# Patient Record
Sex: Male | Born: 1960 | Race: White | Hispanic: No | Marital: Married | Smoking: Never smoker
Health system: Southern US, Community
[De-identification: ages and names within clinical notes are randomized; demographics above are authoritative.]

## PROBLEM LIST (undated history)

## (undated) DIAGNOSIS — I10 Essential (primary) hypertension: Secondary | ICD-10-CM

## (undated) DIAGNOSIS — F909 Attention-deficit hyperactivity disorder, unspecified type: Secondary | ICD-10-CM

## (undated) DIAGNOSIS — F419 Anxiety disorder, unspecified: Secondary | ICD-10-CM

---

## 1999-12-08 ENCOUNTER — Ambulatory Visit (HOSPITAL_COMMUNITY): Admission: EM | Admit: 1999-12-08 | Discharge: 1999-12-08 | Payer: Self-pay | Admitting: Internal Medicine

## 2000-01-01 ENCOUNTER — Ambulatory Visit (HOSPITAL_COMMUNITY): Admission: RE | Admit: 2000-01-01 | Discharge: 2000-01-01 | Payer: Self-pay | Admitting: Gastroenterology

## 2000-01-01 ENCOUNTER — Encounter: Payer: Self-pay | Admitting: Gastroenterology

## 2003-11-19 ENCOUNTER — Emergency Department (HOSPITAL_COMMUNITY): Admission: EM | Admit: 2003-11-19 | Discharge: 2003-11-19 | Payer: Self-pay | Admitting: Emergency Medicine

## 2007-11-11 ENCOUNTER — Ambulatory Visit (HOSPITAL_COMMUNITY): Admission: RE | Admit: 2007-11-11 | Discharge: 2007-11-11 | Payer: Self-pay | Admitting: Gastroenterology

## 2010-04-20 ENCOUNTER — Encounter: Admission: RE | Admit: 2010-04-20 | Discharge: 2010-04-20 | Payer: Self-pay | Admitting: Gastroenterology

## 2010-10-03 NOTE — Op Note (Signed)
NAME:  Brett Hester, Brett Hester               ACCOUNT NO.:  [id number]   MEDICAL RECORD NO.:  [id number]          PATIENT TYPE:  AMB   LOCATION:  ENDO                         FACILITY:  [facility]   PHYSICIAN:  James L. Malon Kindle., M.D.DATE OF BIRTH:  1961-02-25   DATE OF PROCEDURE:  11/11/2007  DATE OF DISCHARGE:                               OPERATIVE REPORT   HISTORY:  The patient has had a history of previous meat impaction and  was eating steak tonight and he has been unable swallow since.  We had  come to the emergency room while a brief history was obtained after the  endoscopy.  The patient is on Aciphex and some blood pressure  medication.   ALLERGIES:  HE HAS NO DRUG ALLERGY.   He has no heart disease.  His only medical problem is mild hypertension.  He has had a history of previous meat impactions that have been removed.  He is suppose to take Aciphex, but due to financial reasons has not been  taking it.   PHYSICAL EXAMINATION:  HEART:  Regular rate and rhythm without murmurs  or gallops.  LUNGS:  Clear.  THROAT:  Normal.   PROCEDURE:  Esophagogastroduodenoscopy with removal of meat impaction.   MEDICATIONS:  Cetacaine spray, fentanyl 100 mcg, Versed 12 mg IV.   DESCRIPTION OF PROCEDURE:  The procedure, including the risks of  perforation, bleeding or possibility of further surgery to remove this  were discussed with the patient prior to the procedure.  He has had it  done a couple of times, so he is well aware of these.  He was sedated.  The scope was passed.  A large meat impaction was seen in the distal  esophagus with a large amount of saliva.  This was sucked out.  We  attempted to get the Lucina Mellow net around the meat, it was too hard and I  switched over to the tripod retriever.  Multiple pieces were flaked off  with the tripod, and then we were able to reinsert the Lucina Mellow net and  pulled two large pieces out.  We placed the scope and the remainder of  it pushed through easily  into the stomach.  The stomach was entered.  A  large amount of food material was seen in the stomach which was along  the lesser curve.  The duodenum, including the bulb and second portion  were fine.  The scope was withdrawn back into the esophagus.  There were  several linear ulcerations in the esophagus and a stricture, but no  residual food material.  The proximal esophagus was normal.  The scope  was withdrawn.  There were no immediate complications.  The patient was  resting comfortably.   ASSESSMENT:  1. Meat impaction in the distal esophagus removed.  2. Stricture of the distal esophagus.  3. Ulcerative esophagitis, partially due to him not taking any      Aciphex, as well as to the meat being impacted during the      procedure.   PLAN:  I have discussed this with his wife.  He will  take liquids  tonight, Aciphex with a sip of water.  Tomorrow, start on clear liquids.  Call for any signs of chest pain, shortness of breath, bleeding, etc.  Will follow up with Dr. Madilyn Fireman in 1 month with possible dilatation.           ______________________________  Llana Aliment Malon Kindle., M.D.     Waldron Session  D:  11/11/2007  T:  11/12/2007  Job:  [id number]   cc:   Carma Leaven, DO  Fax: [phone]   Everardo All. Madilyn Fireman, M.D.  Fax: [phone]

## 2010-10-06 NOTE — Procedures (Signed)
[facility]  Patient:    Brett Hester, Brett Hester                      MRN: [id number] Proc. Date: 01/01/00 Adm. Date:  [id number] Attending:  Louie Bun                           Procedure Report  PROCEDURE:  Esophagogastroduodenoscopy with esophageal dilatation.  INDICATIONS FOR PROCEDURE:  A patient who presented with an esophageal food impaction requiring endoscopic impaction with stricture suggested. He has been on Prilosec in the interim and procedure is for elective inspection of the distal esophagus and to perform a dilatation if appropriate.  DESCRIPTION OF PROCEDURE:  The patient was placed in the left lateral decubitus position and placed on the pulse monitor with continuous low flow oxygen delivered by nasal cannula. He was sedated with 80 mg IV Demerol and 9 mg IV Versed. The Olympus video endoscope was advanced under direct vision into the oropharynx and esophagus. The esophagus was straight and of normal caliber with the squamocolumnar line at 38 cm. There was some indistinct fibrosis around the gastroesophageal junction with no clear cut ring but suggestive of a mild stricture. There was also 1 or 2 persistent erosions, 1 with a small amount of exudate extending up 1-2 cm from the gastroesophageal junction. There is no visible suspicious of neoplasm. The stomach was entered and a small amount of liquid secretions were suctioned from the fundus. Retroflexed view of the cardia was unremarkable. There was no discernible hiatal hernia. The fundus, body, antrum and pylorus all appeared normal. The duodenum was entered and both the bulb and second portion were well inspected and appeared to be within normal limits. The Savary guidewire was placed through the endoscope channel and the scope withdrawn. A single Savary dilator of 16 mm was passed under fluoroscopic visualization with no blood seen on withdrawal and minimal resistance. This dilator  was removed together with the wire and the patient returned to the recovery room in stable condition. The patient tolerated the procedure well and there were no immediate complications.  IMPRESSION:  1. Mild to moderate erosive esophagitis.  2. Mild distal esophageal stricture status post dilatation to 16 mm.  PLAN:  Continue Prilosec and antireflux precautions. Follow-up in the office in 6-8 weeks. DD:  01/01/00 TD:  01/01/00 Job: [id number]

## 2010-10-06 NOTE — Procedures (Signed)
[facility]  Patient:    Brett Hester, Brett Hester                      MRN: [id number] Proc. Date: 12/08/99 Adm. Date:  [id number] Disc. Date: [id number] Attending:  Devoria Albe CC:         Dellis Anes. Idell Pickles, M.D.                           Procedure Report  PROCEDURE:  Esophagogastroduodenoscopy with removal of foreign body.  ENDOSCOPIST:  Everardo All. Madilyn Fireman, M.D.  ANESTHESIA:  INDICATIONS FOR PROCEDURE:  Sensation of esophageal obstruction after choking on a pork chop earlier this evening.  Unable to clear saliva, keep any liquids or solids since with efforts in the emergency room to effect passage of the ______ by pharmacologic means unsuccessful.  DESCRIPTION OF PROCEDURE:  The patient was placed in the left lateral decubitus position and placed on the pulse monitor with continuous low flow oxygen delivered by nasal cannula.  He was sedated with 60 mg of IV Demerol and 6 mg of IV Versed.  The Olympus video endoscope was advanced under direct vision into the oropharynx and esophagus.  The esophagus was filled with ________ which was easily suctioned, revealing a solid food bolus that was impacted in the distal esophagus.  I could not push the bolus through the GE junction and used the rat-tooth forceps to remove about half of it through the mouth.  The scope was reintroduced and the remaining fragment was able to be pushed into the stomach revealing friability around a probable ______ stricture or esophageal ring above the 3 cm hiatal hernia.  Due to some food in the stomach and the patient retching, I did not spend a lot of time examining the stomach and did not intubate the duodenum.  The scope was drawn back into the esophagus and no further solid food fragments were seen.  The patient was then withdrawn and the patient returned to the recovery room in stable condition.  He tolerated the procedure well and there were no immediate complications.  IMPRESSION: 1.  Foreign body in the esophagus. 2. Lower esophageal ring with hiatal hernia.  PLAN:  Trial of proton pump inhibitor and we will reschedule EGD with elective dilatation in approximately three to four weeks. DD:  12/08/99 TD:  12/12/99 Job: [id number]

## 2010-10-29 ENCOUNTER — Ambulatory Visit (HOSPITAL_COMMUNITY)
Admission: EM | Admit: 2010-10-29 | Discharge: 2010-10-29 | Disposition: A | Discharge status: Home / Self Care | Payer: BC Managed Care – PPO | Source: Ambulatory Visit | Attending: Gastroenterology | Admitting: Gastroenterology

## 2010-10-29 DIAGNOSIS — K222 Esophageal obstruction: Secondary | ICD-10-CM | POA: Insufficient documentation

## 2010-10-29 DIAGNOSIS — IMO0002 Reserved for concepts with insufficient information to code with codable children: Secondary | ICD-10-CM | POA: Insufficient documentation

## 2010-10-29 DIAGNOSIS — T18108A Unspecified foreign body in esophagus causing other injury, initial encounter: Secondary | ICD-10-CM | POA: Insufficient documentation

## 2010-12-06 NOTE — Op Note (Signed)
  NAMERODRIGO, MCGRANAHAN NO.:  [id number]  MEDICAL RECORD NO.:  [id number]  LOCATION:  ENDO                         FACILITY:  [facility]  PHYSICIAN:  Orra Nolde C. Madilyn Fireman, M.D.    DATE OF BIRTH:  19-Sep-1960  DATE OF PROCEDURE:  10/29/2010 DATE OF DISCHARGE:  10/29/2010                              OPERATIVE REPORT   ESOPHAGOGASTRODUODENOSCOPY WITH REMOVAL OF FOREIGN BODY  INDICATIONS FOR PROCEDURE:  The patient with history of recurrent esophageal food impactions.  In past, it has not been clear that he has a definable esophageal stricture and the appearance of the esophageal mucosa on one occasion was felt to suggest eosinophilic esophagitis. However, biopsies have not bourne this out.  A barium swallow with tablet was done, which showed a short distal stricture in December 2011, it was recommended we go ahead and proceed with EGD, but the patient has not complied.  The patient was eating chicken and biscuit at lunch time today about 5 hours ago and felt sensation of a bite of chicken impact into the esophagus, and since then, he has been unable to swallow and has spitting up his saliva.  PROCEDURE:  The patient was placed in the left lateral decubitus position and placed on the pulse monitor with continuous low-flow oxygen delivered by nasal cannula.  He was sedated with 100 mcg IV fentanyl and 8 mg IV Versed.  The Olympus video endoscope was advanced under direct vision into the oropharynx and esophagus.  The esophagus was straight and of normal caliber.  There was a lot of saliva proximally.  In the distal esophagus, there was seen a food bolus consistent with a piece of chicken he described.  It was pushed with mild force and released through the GE junction into the stomach.  The underlying mucosa about somewhat boggy and edematous, but I could not clearly discern any discrete stricture or ring.  There were some mild changes of concentric rings, but this was not  traumatic.  There was no esophageal laceration or ulcer.  The stomach was entered and a small amount of liquid secretions were suctioned from the fundus.  Retroflexed view of cardia was unremarkable.  The fundus, body, antrum, and pylorus all appeared normal.  The duodenum was entered, both bulb and second portion were inspected, and appeared to be within normal limits.  Scope was then withdrawn and the patient returned to the recovery room in stable condition.  He tolerated the procedure well.  There were no immediate complications.  IMPRESSION:  Solid food bolus in the distal esophagus removed and pushed into the stomach endoscopically.  PLAN:  We will start proton pump inhibitor and recommend that he follow up with EGD and esophageal dilatation within a month or 2.          ______________________________ Everardo All. Madilyn Fireman, M.D.     [facility]/MED[facility]  D:  10/29/2010  T:  10/30/2010  Job:  [id number]  Electronically Signed by Dorena Cookey M.D. on 12/06/2010 07:00:12 PM

## 2015-09-16 MED FILL — DEXTROAMP-AMPHET ER 20 MG C: 20 | 30 days supply | Qty: 30 | Fill #0

## 2015-09-19 MED FILL — GAVILYTE-N SOLUTION: 420 | 1 days supply | Qty: 4000 | Fill #0

## 2015-09-30 MED FILL — OMEPRAZOLE DR 40 MG CAPSULE: 40 | 30 days supply | Qty: 60 | Fill #0

## 2015-10-07 MED FILL — RABEPRAZOLE SOD DR 20 MG TA: 20 | 30 days supply | Qty: 30 | Fill #0

## 2015-10-19 MED FILL — VYVANSE 10 MG CAPSULE: 10 | 30 days supply | Qty: 30 | Fill #0

## 2016-02-14 MED FILL — CEPHALEXIN 500 MG CAPSULE: 500 | 10 days supply | Qty: 20 | Fill #0

## 2016-02-15 MED FILL — predniSONE 10 MG TABS: 10 | 12 days supply | Qty: 42 | Fill #0

## 2016-02-15 MED FILL — SSD 1% CREAM: 1 | 10 days supply | Qty: 50 | Fill #0

## 2016-02-20 MED FILL — MYDAYIS ER 37.5 MG CAPSULE: 37.5 | 30 days supply | Qty: 30 | Fill #0

## 2016-02-20 MED FILL — ALPRAZolam 0.5 MG TABS: 0.5 | 7 days supply | Qty: 7 | Fill #0

## 2016-02-24 MED FILL — RABEPRAZOLE SOD DR 20 MG TA: 20 | 30 days supply | Qty: 30 | Fill #1

## 2016-02-28 MED FILL — ATORVASTATIN 10 MG TABLET: 10 | 30 days supply | Qty: 30 | Fill #0

## 2016-04-02 MED FILL — MYDAYIS ER 37.5 MG CAPSULE: 37.5 | 30 days supply | Qty: 30 | Fill #0

## 2016-05-28 MED FILL — RABEPRAZOLE SOD DR 20 MG TA: 20 | 30 days supply | Qty: 30 | Fill #2

## 2016-06-02 ENCOUNTER — Encounter (HOSPITAL_COMMUNITY): Payer: Self-pay | Admitting: Emergency Medicine

## 2016-06-02 ENCOUNTER — Emergency Department (HOSPITAL_COMMUNITY): Payer: BLUE CROSS/BLUE SHIELD

## 2016-06-02 ENCOUNTER — Observation Stay (HOSPITAL_COMMUNITY)
Admission: EM | Admit: 2016-06-02 | Discharge: 2016-06-03 | Disposition: A | Discharge status: Home / Self Care | Payer: BLUE CROSS/BLUE SHIELD | Attending: Internal Medicine | Admitting: Internal Medicine

## 2016-06-02 DIAGNOSIS — F909 Attention-deficit hyperactivity disorder, unspecified type: Secondary | ICD-10-CM | POA: Insufficient documentation

## 2016-06-02 DIAGNOSIS — I10 Essential (primary) hypertension: Secondary | ICD-10-CM | POA: Insufficient documentation

## 2016-06-02 DIAGNOSIS — Z6835 Body mass index (BMI) 35.0-35.9, adult: Secondary | ICD-10-CM | POA: Diagnosis not present

## 2016-06-02 DIAGNOSIS — F419 Anxiety disorder, unspecified: Secondary | ICD-10-CM | POA: Diagnosis not present

## 2016-06-02 DIAGNOSIS — G459 Transient cerebral ischemic attack, unspecified: Secondary | ICD-10-CM | POA: Diagnosis not present

## 2016-06-02 DIAGNOSIS — R202 Paresthesia of skin: Secondary | ICD-10-CM | POA: Diagnosis not present

## 2016-06-02 DIAGNOSIS — I160 Hypertensive urgency: Principal | ICD-10-CM

## 2016-06-02 DIAGNOSIS — E785 Hyperlipidemia, unspecified: Secondary | ICD-10-CM | POA: Diagnosis not present

## 2016-06-02 DIAGNOSIS — Z79899 Other long term (current) drug therapy: Secondary | ICD-10-CM | POA: Insufficient documentation

## 2016-06-02 DIAGNOSIS — E669 Obesity, unspecified: Secondary | ICD-10-CM | POA: Insufficient documentation

## 2016-06-02 DIAGNOSIS — R42 Dizziness and giddiness: Secondary | ICD-10-CM

## 2016-06-02 HISTORY — DX: Attention-deficit hyperactivity disorder, unspecified type: F90.9

## 2016-06-02 HISTORY — DX: Essential (primary) hypertension: I10

## 2016-06-02 HISTORY — DX: Anxiety disorder, unspecified: F41.9

## 2016-06-02 LAB — URINALYSIS, ROUTINE W REFLEX MICROSCOPIC
BILIRUBIN URINE: NEGATIVE
Bacteria, UA: NONE SEEN
Glucose, UA: NEGATIVE mg/dL
HGB URINE DIPSTICK: NEGATIVE
KETONES UR: NEGATIVE mg/dL
NITRITE: NEGATIVE
PH: 6 (ref 5.0–8.0)
Protein, ur: NEGATIVE mg/dL
SPECIFIC GRAVITY, URINE: 1.005 (ref 1.005–1.030)
Squamous Epithelial / LPF: NONE SEEN

## 2016-06-02 LAB — COMPREHENSIVE METABOLIC PANEL
ALK PHOS: 46 U/L (ref 38–126)
ALT: 20 U/L (ref 17–63)
ANION GAP: 12 (ref 5–15)
AST: 23 U/L (ref 15–41)
Albumin: 4.1 g/dL (ref 3.5–5.0)
BUN: 13 mg/dL (ref 6–20)
CALCIUM: 10 mg/dL (ref 8.9–10.3)
CO2: 24 mmol/L (ref 22–32)
CREATININE: 1.22 mg/dL (ref 0.61–1.24)
Chloride: 104 mmol/L (ref 101–111)
GFR calc non Af Amer: >60 mL/min (ref >60)
GLUCOSE: 88 mg/dL (ref 65–99)
Potassium: 4.8 mmol/L (ref 3.5–5.1)
SODIUM: 140 mmol/L (ref 135–145)
TOTAL PROTEIN: 6.6 g/dL (ref 6.5–8.1)
Total Bilirubin: 0.6 mg/dL (ref 0.3–1.2)

## 2016-06-02 LAB — I-STAT CHEM 8, ED
BUN: 15 mg/dL (ref 6–20)
CALCIUM ION: 1.25 mmol/L (ref 1.15–1.40)
CHLORIDE: 102 mmol/L (ref 101–111)
CREATININE: 1.2 mg/dL (ref 0.61–1.24)
GLUCOSE: 88 mg/dL (ref 65–99)
HCT: 45 % (ref 39.0–52.0)
Hemoglobin: 15.3 g/dL (ref 13.0–17.0)
Potassium: 4.1 mmol/L (ref 3.5–5.1)
SODIUM: 141 mmol/L (ref 135–145)
TCO2: 29 mmol/L (ref 0–100)

## 2016-06-02 LAB — APTT: aPTT: 23 seconds — ABNORMAL LOW (ref 24–36)

## 2016-06-02 LAB — RAPID URINE DRUG SCREEN, HOSP PERFORMED
AMPHETAMINES: POSITIVE — AB
Barbiturates: NOT DETECTED
Benzodiazepines: NOT DETECTED
Cocaine: NOT DETECTED
Opiates: NOT DETECTED
TETRAHYDROCANNABINOL: NOT DETECTED

## 2016-06-02 LAB — DIFFERENTIAL
Basophils Absolute: 0 10*3/uL (ref 0.0–0.1)
Basophils Relative: 0 %
EOS PCT: 4 %
Eosinophils Absolute: 0.3 10*3/uL (ref 0.0–0.7)
LYMPHS ABS: 2.2 10*3/uL (ref 0.7–4.0)
LYMPHS PCT: 28 %
MONO ABS: 0.6 10*3/uL (ref 0.1–1.0)
Monocytes Relative: 7 %
NEUTROS ABS: 4.9 10*3/uL (ref 1.7–7.7)
Neutrophils Relative %: 61 %

## 2016-06-02 LAB — CBC
HCT: 45.9 % (ref 39.0–52.0)
HEMOGLOBIN: 15.1 g/dL (ref 13.0–17.0)
MCH: 30.3 pg (ref 26.0–34.0)
MCHC: 32.9 g/dL (ref 30.0–36.0)
MCV: 92 fL (ref 78.0–100.0)
PLATELETS: 208 10*3/uL (ref 150–400)
RBC: 4.99 MIL/uL (ref 4.22–5.81)
RDW: 12.7 % (ref 11.5–15.5)
WBC: 8 10*3/uL (ref 4.0–10.5)

## 2016-06-02 LAB — I-STAT TROPONIN, ED: Troponin i, poc: 0 ng/mL (ref 0.00–0.08)

## 2016-06-02 LAB — PROTIME-INR
INR: 0.94
PROTHROMBIN TIME: 12.6 s (ref 11.4–15.2)

## 2016-06-02 LAB — ETHANOL: Alcohol, Ethyl (B): <5 mg/dL (ref <5)

## 2016-06-02 MED ORDER — ACETAMINOPHEN 160 MG/5ML PO SOLN: 650.0000 mg | ORAL | Status: DC | PRN

## 2016-06-02 MED ORDER — ENOXAPARIN SODIUM 40 MG/0.4ML ~~LOC~~ SOLN
40.0000 mg | SUBCUTANEOUS | Status: DC
  Administered 2016-06-03: 40 mg via SUBCUTANEOUS
  Filled 2016-06-02: qty 0.4

## 2016-06-02 MED ORDER — ACETAMINOPHEN 650 MG RE SUPP: 650.0000 mg | RECTAL | Status: DC | PRN

## 2016-06-02 MED ORDER — ACETAMINOPHEN 325 MG PO TABS: 650.0000 mg | ORAL_TABLET | ORAL | Status: DC | PRN

## 2016-06-02 MED ORDER — STROKE: EARLY STAGES OF RECOVERY BOOK
Freq: Once | Status: AC
  Administered 2016-06-02: 23:00:00
  Filled 2016-06-02: qty 1

## 2016-06-02 MED ORDER — PANTOPRAZOLE SODIUM 40 MG PO TBEC
40.0000 mg | DELAYED_RELEASE_TABLET | Freq: Every day | ORAL | Status: DC
  Administered 2016-06-03: 40 mg via ORAL
  Filled 2016-06-02: qty 1

## 2016-06-02 MED ORDER — ALPRAZOLAM 0.5 MG PO TABS: 0.5000 mg | ORAL_TABLET | Freq: Every evening | ORAL | Status: DC | PRN

## 2016-06-02 MED ORDER — ASPIRIN EC 81 MG PO TBEC
81.0000 mg | DELAYED_RELEASE_TABLET | Freq: Every day | ORAL | Status: DC
  Administered 2016-06-03: 81 mg via ORAL
  Filled 2016-06-02: qty 1

## 2016-06-02 NOTE — ED Provider Notes (Signed)
[facility]-EMERGENCY DEPT Provider Note   CSN: [id number] Arrival date & time: 06/02/16  1736     History   Chief Complaint Chief Complaint  Patient presents with  . Dizziness    HPI Brett Hester is a 56 y.o. male with a pmh of hypertension and ADHD. He states that at 4:15pm today he had sudden onset of left facial paresthesia and tongue numbness lasting about 10 sec. He had associated lightheadedness and a sense of head "fullness" that lasted 45 minutes and has been intermittent since that time. He Reports that when EMS arrived to his house his systolic pressure was in the 200s. Is currently resolved without intervention. He denies any other neurologic deficits such as headache, difficulty with speech, facial droop, unilateral weakness. The patient does a daily dose of 37.5 mg of a stimulant medication for his adhd. He also takes medication for his BP daily. He has a family hx of atherosclerosis. He denies a smoking history and has no hx of hyperlipidemia.    HPI  Past Medical History:  Diagnosis Date  . ADHD   . Anxiety   . Hypertension     Patient Active Problem List   Diagnosis Date Noted  . Hypertensive urgency 06/02/2016  . TIA (transient ischemic attack) 06/02/2016    History reviewed. No pertinent surgical history.     Home Medications    Prior to Admission medications   Medication Sig Start Date End Date Taking? Authorizing Provider  ALPRAZolam Prudy Feeler(XANAX) 0.5 MG tablet Take 0.5 mg by mouth at bedtime as needed for anxiety.   Yes Historical Provider, MD  Amphet-Dextroamphet 3-Bead ER (MYDAYIS) 37.5 MG CP24 Take by mouth.   Yes Historical Provider, MD  RABEprazole (ACIPHEX) 20 MG tablet Take 20 mg by mouth daily.   Yes Historical Provider, MD    Family History History reviewed. No pertinent family history.  Social History Social History  Substance Use Topics  . Smoking status: Never Smoker  . Smokeless tobacco: Not on file  . Alcohol use No     Allergies     Patient has no known allergies.   Review of Systems Review of Systems Ten systems reviewed and are negative for acute change, except as noted in the HPI.    Physical Exam Updated Vital Signs BP 136/82   Pulse 69   Temp 98.4 F (36.9 C) (Oral)   Resp 25   Ht 6' (1.829 m)   Wt 120.2 kg   SpO2 97%   BMI 35.94 kg/m   Physical Exam  Constitutional: He is oriented to person, place, and time. He appears well-developed and well-nourished. No distress.  HENT:  Head: Normocephalic and atraumatic.  Eyes: Conjunctivae are normal. No scleral icterus.  Neck: Normal range of motion. Neck supple.  Cardiovascular: Normal rate, regular rhythm and normal heart sounds.   Pulmonary/Chest: Effort normal and breath sounds normal. No respiratory distress.  Abdominal: Soft. There is no tenderness.  Musculoskeletal: He exhibits no edema.  Neurological: He is alert and oriented to person, place, and time.  Speech is clear and goal oriented, follows commands Major Cranial nerves without deficit, no facial droop Normal strength in upper and lower extremities bilaterally including dorsiflexion and plantar flexion, strong and equal grip strength Sensation normal to light and sharp touch Moves extremities without ataxia, coordination intact Normal finger to nose and rapid alternating movements Neg romberg, no pronator drift Normal gait Normal heel-shin and balance   Skin: Skin is warm and dry. He is  not diaphoretic.  Psychiatric: His behavior is normal.  Nursing note and vitals reviewed.    ED Treatments / Results  Labs (all labs ordered are listed, but only abnormal results are displayed) Labs Reviewed  APTT - Abnormal; Notable for the following:       Result Value   aPTT 23 (*)    All other components within normal limits  RAPID URINE DRUG SCREEN, HOSP PERFORMED - Abnormal; Notable for the following:    Amphetamines POSITIVE (*)    All other components within normal limits  URINALYSIS,  ROUTINE W REFLEX MICROSCOPIC - Abnormal; Notable for the following:    Color, Urine STRAW (*)    Leukocytes, UA SMALL (*)    All other components within normal limits  ETHANOL  PROTIME-INR  CBC  DIFFERENTIAL  COMPREHENSIVE METABOLIC PANEL  RAPID URINE DRUG SCREEN, HOSP PERFORMED  URINALYSIS, ROUTINE W REFLEX MICROSCOPIC  HEMOGLOBIN A1C  LIPID PANEL  I-STAT CHEM 8, ED  I-STAT TROPOININ, ED    EKG  EKG Interpretation None       Radiology Ct Head Code Stroke Wo Contrast  Result Date: 06/02/2016 CLINICAL DATA:  Code stroke. Dizziness with LEFT facial and tongue numbness beginning earlier today. EXAM: CT HEAD WITHOUT CONTRAST TECHNIQUE: Contiguous axial images were obtained from the base of the skull through the vertex without intravenous contrast. COMPARISON:  None. FINDINGS: Brain: No evidence of acute infarction, hemorrhage, hydrocephalus, extra-axial collection or mass lesion/mass effect. Normal cerebral volume. Mild hypoattenuation of white matter could represent early chronic microvascular ischemic change. Vascular: No hyperdense vessel or unexpected calcification. Skull: Normal. Negative for fracture or focal lesion. Sinuses/Orbits: No acute finding. Other: None. ASPECTS [facility] Stroke Program Early CT Score) - Ganglionic level infarction (caudate, lentiform nuclei, internal capsule, insula, M1-M3 cortex): 7 - Supraganglionic infarction (M4-M6 cortex): 3 Total score (0-10 with 10 being normal): 10 IMPRESSION: 1. No acute intracranial findings. Mild chronic microvascular ischemic changes suspected. 2. ASPECTS is 10. A call was placed to the stroke neurologist at 7:15 p.m., 06/02/2016. Electronically Signed   By: Elsie Stain M.D.   On: 06/02/2016 19:19    Procedures Procedures (including critical care time)  Medications Ordered in ED Medications  acetaminophen (TYLENOL) tablet 650 mg (not administered)    Or  acetaminophen (TYLENOL) solution 650 mg (not administered)    Or    acetaminophen (TYLENOL) suppository 650 mg (not administered)  enoxaparin (LOVENOX) injection 40 mg (not administered)  aspirin EC tablet 81 mg (not administered)  ALPRAZolam (XANAX) tablet 0.5 mg (not administered)  pantoprazole (PROTONIX) EC tablet 40 mg (not administered)  LORazepam (ATIVAN) injection 1 mg (not administered)   stroke: mapping our early stages of recovery book ( Does not apply Given 06/02/16 2315)     Initial Impression / Assessment and Plan / ED Course  I have reviewed the triage vital signs and the nursing notes.  Pertinent labs & imaging results that were available during my care of the patient were reviewed by me and considered in my medical decision making (see chart for details).  Clinical Course     Patient seen by Dr. Otelia Limes. Code stroke discontinued. Patient will need to be admitted for a TIA work up Patient admitted by Dr. Julian Reil Final Clinical Impressions(s) / ED Diagnoses   Final diagnoses:  Dizziness  Transient cerebral ischemia, unspecified type    New Prescriptions Current Discharge Medication List       Arthor Captain, PA-C 06/03/16 0044    Margarita Grizzle, MD  06/07/16 1336  

## 2016-06-02 NOTE — ED Notes (Signed)
Patient physically moved from hallway to room A12; patient undressed, in gown, on monitor, continuous pulse oximetry and blood pressure cuff; visitor at bedside

## 2016-06-02 NOTE — ED Notes (Signed)
[facility] contacted to activate Code Stroke 

## 2016-06-02 NOTE — H&P (Signed)
History and Physical    Brett Hester WUJ:[id number] DOB: [birth year] DOA: 06/02/2016   PCP: Thora Lance, MD Chief Complaint:  Chief Complaint  Patient presents with  . Dizziness    HPI: Brett Hester is a 56 y.o. male with medical history significant of HTN, anxiety, ADHD.  Patient presents to the ED after sudden onset at 4:15PM today of L facial paresthesia, tongue numbness.  Symptoms lasted for about 10 seconds.  Had associated lightheadedness and sense of head "fullness" that lasted 45 mins and has been intermittent since then.  EMS arrived to house and SBP was in the 200s (took it twice he says).  ED Course: SBP 180s in triage and has continued to trend down on its own without intervention, now running 140s.  Review of Systems: As per HPI otherwise 10 point review of systems negative.    Past Medical History:  Diagnosis Date  . ADHD   . Anxiety   . Hypertension     History reviewed. No pertinent surgical history.   reports that he has never smoked. He does not have any smokeless tobacco history on file. He reports that he does not drink alcohol or use drugs.  No Known Allergies  History reviewed. No pertinent family history.  Does have family history of atherosclerosis.   Prior to Admission medications   Medication Sig Start Date End Date Taking? Authorizing Provider  ALPRAZolam Prudy Feeler) 0.5 MG tablet Take 0.5 mg by mouth at bedtime as needed for anxiety.   Yes Historical Provider, MD  Amphet-Dextroamphet 3-Bead ER (MYDAYIS) 37.5 MG CP24 Take by mouth.   Yes Historical Provider, MD  RABEprazole (ACIPHEX) 20 MG tablet Take 20 mg by mouth daily.   Yes Historical Provider, MD    Physical Exam: Vitals:   06/02/16 2100 06/02/16 2130 06/02/16 2200 06/02/16 2230  BP: 142/83 135/84 139/88 140/84  Pulse: 66 76 69 70  Resp:  20 22 22   Temp:      TempSrc:      SpO2: 98% 96% 96% 96%  Weight:      Height:          Constitutional: NAD, calm, comfortable Eyes:  PERRL, lids and conjunctivae normal ENMT: Mucous membranes are moist. Posterior pharynx clear of any exudate or lesions.Normal dentition.  Neck: normal, supple, no masses, no thyromegaly Respiratory: clear to auscultation bilaterally, no wheezing, no crackles. Normal respiratory effort. No accessory muscle use.  Cardiovascular: Regular rate and rhythm, no murmurs / rubs / gallops. No extremity edema. 2+ pedal pulses. No carotid bruits.  Abdomen: no tenderness, no masses palpated. No hepatosplenomegaly. Bowel sounds positive.  Musculoskeletal: no clubbing / cyanosis. No joint deformity upper and lower extremities. Good ROM, no contractures. Normal muscle tone.  Skin: no rashes, lesions, ulcers. No induration Neurologic: CN 2-12 grossly intact. Sensation intact, DTR normal. Strength 5/5 in all 4.  Psychiatric: Normal judgment and insight. Alert and oriented x 3. Normal mood.    Labs on Admission: I have personally reviewed following labs and imaging studies  CBC:  Recent Labs Lab 06/02/16 1904 06/02/16 1912  WBC 8.0  --   NEUTROABS 4.9  --   HGB 15.1 15.3  HCT 45.9 45.0  MCV 92.0  --   PLT 208  --    Basic Metabolic Panel:  Recent Labs Lab 06/02/16 1904 06/02/16 1912  NA 140 141  K 4.8 4.1  CL 104 102  CO2 24  --   GLUCOSE 88 88  BUN 13  15  CREATININE 1.22 1.20  CALCIUM 10.0  --    GFR: Estimated Creatinine Clearance: 95.3 mL/min (by C-G formula based on SCr of 1.2 mg/dL). Liver Function Tests:  Recent Labs Lab 06/02/16 1904  AST 23  ALT 20  ALKPHOS 46  BILITOT 0.6  PROT 6.6  ALBUMIN 4.1   No results for input(s): LIPASE, AMYLASE in the last 168 hours. No results for input(s): AMMONIA in the last 168 hours. Coagulation Profile:  Recent Labs Lab 06/02/16 1904  INR 0.94   Cardiac Enzymes: No results for input(s): CKTOTAL, CKMB, CKMBINDEX, TROPONINI in the last 168 hours. BNP (last 3 results) No results for input(s): PROBNP in the last 8760  hours. HbA1C: No results for input(s): HGBA1C in the last 72 hours. CBG: No results for input(s): GLUCAP in the last 168 hours. Lipid Profile: No results for input(s): CHOL, HDL, LDLCALC, TRIG, CHOLHDL, LDLDIRECT in the last 72 hours. Thyroid Function Tests: No results for input(s): TSH, T4TOTAL, FREET4, T3FREE, THYROIDAB in the last 72 hours. Anemia Panel: No results for input(s): VITAMINB12, FOLATE, FERRITIN, TIBC, IRON, RETICCTPCT in the last 72 hours. Urine analysis:    Component Value Date/Time   COLORURINE STRAW (A) 06/02/2016 1922   APPEARANCEUR CLEAR 06/02/2016 1922   LABSPEC 1.005 06/02/2016 1922   PHURINE 6.0 06/02/2016 1922   GLUCOSEU NEGATIVE 06/02/2016 1922   HGBUR NEGATIVE 06/02/2016 1922   BILIRUBINUR NEGATIVE 06/02/2016 1922   KETONESUR NEGATIVE 06/02/2016 1922   PROTEINUR NEGATIVE 06/02/2016 1922   NITRITE NEGATIVE 06/02/2016 1922   LEUKOCYTESUR SMALL (A) 06/02/2016 1922   Sepsis Labs: @LABRCNTIP (procalcitonin:4,lacticidven:4) )No results found for this or any previous visit (from the past 240 hour(s)).   Radiological Exams on Admission: Ct Head Code Stroke Wo Contrast  Result Date: 06/02/2016 CLINICAL DATA:  Code stroke. Dizziness with LEFT facial and tongue numbness beginning earlier today. EXAM: CT HEAD WITHOUT CONTRAST TECHNIQUE: Contiguous axial images were obtained from the base of the skull through the vertex without intravenous contrast. COMPARISON:  None. FINDINGS: Brain: No evidence of acute infarction, hemorrhage, hydrocephalus, extra-axial collection or mass lesion/mass effect. Normal cerebral volume. Mild hypoattenuation of white matter could represent early chronic microvascular ischemic change. Vascular: No hyperdense vessel or unexpected calcification. Skull: Normal. Negative for fracture or focal lesion. Sinuses/Orbits: No acute finding. Other: None. ASPECTS [facility]([place] Stroke Program Early CT Score) - Ganglionic level infarction (caudate, lentiform  nuclei, internal capsule, insula, M1-M3 cortex): 7 - Supraganglionic infarction (M4-M6 cortex): 3 Total score (0-10 with 10 being normal): 10 IMPRESSION: 1. No acute intracranial findings. Mild chronic microvascular ischemic changes suspected. 2. ASPECTS is 10. A call was placed to the stroke neurologist at 7:15 p.m., 06/02/2016. Electronically Signed   By: Elsie StainJohn T Curnes M.D.   On: 06/02/2016 19:19    EKG: Independently reviewed.  Assessment/Plan Principal Problem:   Hypertensive urgency Active Problems:   TIA (transient ischemic attack)    1. Hypertensive urgency - 1. Likely underlying untreated HTN, possibly exacerbated by CNS stimulant (Mydayis), if symptoms reoccur may need to consider DCing this drug / weight risks vs benefits of staying on drug. 2. Monitor HTN during stay, if elevates again then plan to start meds. 3. Tele monitor 4. Likely needs follow up with PCP for evaluation and treatment of HTN 5. Should get home blood pressure meter and monitor BP 3x per day at home and keep diary for review by PCP. 2. Possible TIA - alternatively symptoms could have been due to TIA though this is less  likely 1. Never the less will put him on stroke pathway for work up 2. MRI/MRA 3. 2d echo 4. Carotid dopplers 5. Lipid profile and A1C 6. ASA 81 daily 7. PT/OT/SLP   DVT prophylaxis: Lovenox Code Status: Full Family Communication: Family at bedside Consults called: Neurology Admission status: Place in [place], Heywood Iles. DO [facility] Pager [phone] from 7PM-7AM  If 7AM-7PM, please contact the day physician for the patient [email or web address] Password [facility]  06/02/2016, 11:06 PM

## 2016-06-02 NOTE — ED Triage Notes (Addendum)
Pt BIB EMS from home where pt was cleaning house when he had onset of "head pressure, tingling in the left side of face, and dizziness." Pt denies tingling on assessment, states "I still feel pressure in the back of my head and feel a little dizzy." Pt ambulated to the restroom. Pt A&Ox4; resp e/u; NAD noted at this time.

## 2016-06-02 NOTE — Code Documentation (Signed)
At 4:15pm he suddenly noted left face and tongue felt numb for about 15 sec and he felt dizzy and had a "full" feeling in his head for about and hour.  Per patient the 1st bp taken on scene was greater than 200 over the next hour his BP improved.  Upon arrival to the hospital his symptoms have resolved  BP 154/88 SR 68  O2 sat 98%.  Code Stroke called at 1855  Stat head CT and labs done.  NIHSS 0  Dr Otelia LimesLindzen at bedside to assess patient.

## 2016-06-02 NOTE — Consult Note (Signed)
NEURO HOSPITALIST CONSULT NOTE   Requestig physician: Dr. Rosalia Hammersay  Reason for Consult: Sudden onset of left face and bilateral tongue sensory numbness  History obtained from:  Patient  HPI:                                                                                                                                          Brett Hester is an 56 y.o. male who presented from home after acute onset of combined presyncopal sensation with vertigo, "head pressure" sensation, left facial tingling and bilateral tongue paresthesias.Symptom onset was at 4:15 PM while cleaning his house. Per ED staff, on arrival he stated  "I still feel pressure in the back of my head and feel a little dizzy. He was able to ambulate to the restroom without difficulty.   Denies vision changes, weakness, neck pain, chest pain, abdominal pain, diarrhea, SOB or limb pain.   Past Medical History:  Diagnosis Date  . Hypertension     History reviewed. No pertinent surgical history.  No family history on file.  Social History:  reports that he has never smoked. He does not have any smokeless tobacco history on file. He reports that he does not drink alcohol or use drugs.  Not on File  HOME MEDICATIONS:                                                                                                                     ALPRAZolam (XANAX) 0.5 MG tablet Take 0.5 mg by mouth at bedtime as needed for anxiety. Historical Provider, MD Needs Review  Amphet-Dextroamphet 3-Bead ER (MYDAYIS) 37.5 MG CP24 Take by mouth. Historical Provider, MD Needs Review  RABEprazole (ACIPHEX) 20 MG tablet Take 20 mg by mouth daily. Historical Provider, MD Needs Review   ROS:  History obtained from patient. As per HPI.   Blood pressure 154/83, pulse 77, temperature 98.4 F (36.9 C),  temperature source Oral, resp. rate 18, height 6' (1.829 m), weight 120.2 kg (265 lb), SpO2 98 %.  General Examination:                                                                                                      HEENT-  Normocephalic/atraumatic.  Lungs- No gross wheezing. Respirations unlabored.  Extremities- No edema.   Neurological Examination Mental Status: Alert, oriented, thought content appropriate.  Speech fluent without evidence of aphasia.  Able to follow all commands without difficulty. Cranial Nerves: II: Visual fields intact to bedside confrontation testing, PERRL III,IV, VI: ptosis not present, EOMI without nystagmus V,VII: smile symmetric, facial temperature sensation is decreased bilaterally in V3 distribution, otherwise normal  VIII: hearing intact to conversation IX,X: no hypophonia or hoarseness XI: symmetric XII: midline tongue extension Motor: Right : Upper extremity   5/5    Left:     Upper extremity   5/5  Lower extremity   5/5     Lower extremity   5/5 Normal tone throughout; no atrophy noted Sensory: Temperature and light touch intact throughout, except for mild decreased temp sensation to left lower leg. No extinction.  Deep Tendon Reflexes: 2+ and symmetric throughout. Toes downgoing. Cerebellar: No ataxia with FNF or H-S bilaterally. Gait: Deferred.    Lab Results: Basic Metabolic Panel:  Recent Labs Lab 06/02/16 1912  NA 141  K 4.1  CL 102  GLUCOSE 88  BUN 15  CREATININE 1.20    Liver Function Tests: No results for input(s): AST, ALT, ALKPHOS, BILITOT, PROT, ALBUMIN in the last 168 hours. No results for input(s): LIPASE, AMYLASE in the last 168 hours. No results for input(s): AMMONIA in the last 168 hours.  CBC:  Recent Labs Lab 06/02/16 1912  HGB 15.3  HCT 45.0    Cardiac Enzymes: No results for input(s): CKTOTAL, CKMB, CKMBINDEX, TROPONINI in the last 168 hours.  Lipid Panel: No results for input(s): CHOL, TRIG, HDL,  CHOLHDL, VLDL, LDLCALC in the last 168 hours.  CBG: No results for input(s): GLUCAP in the last 168 hours.  Microbiology: No results found for this or any previous visit.  Coagulation Studies: No results for input(s): LABPROT, INR in the last 72 hours.  Imaging: Ct Head Code Stroke Wo Contrast  Result Date: 06/02/2016 CLINICAL DATA:  Code stroke. Dizziness with LEFT facial and tongue numbness beginning earlier today. EXAM: CT HEAD WITHOUT CONTRAST TECHNIQUE: Contiguous axial images were obtained from the base of the skull through the vertex without intravenous contrast. COMPARISON:  None. FINDINGS: Brain: No evidence of acute infarction, hemorrhage, hydrocephalus, extra-axial collection or mass lesion/mass effect. Normal cerebral volume. Mild hypoattenuation of white matter could represent early chronic microvascular ischemic change. Vascular: No hyperdense vessel or unexpected calcification. Skull: Normal. Negative for fracture or focal lesion. Sinuses/Orbits: No acute finding. Other: None. ASPECTS [facility] Stroke Program Early CT Score) - Ganglionic level infarction (caudate, lentiform nuclei, internal capsule, insula, M1-M3 cortex): 7 -  Supraganglionic infarction (M4-M6 cortex): 3 Total score (0-10 with 10 being normal): 10 IMPRESSION: 1. No acute intracranial findings. Mild chronic microvascular ischemic changes suspected. 2. ASPECTS is 10. A call was placed to the stroke neurologist at 7:15 p.m., 06/02/2016. Electronically Signed   By: Elsie Stain M.D.   On: 06/02/2016 19:19    Assessment: 1. Sensory numbness with lightheadedness and vertigo. Findings not localizable to a single CNS or PNS location. DDx includes anxiety related symptoms versus hypertensive urgency/emergency with vasospasm resulting in multifocal symptomatology. TIA felt to be less likely.  2. Severe HTN at presentation. He has a history of hypertension.  3. History of anxiety.   Recommendations: 1. Consider  discontinuation of sympathomimetic medication (Mydayis) as this may precipitate attacks of anxiety and can also worsen HTN.  2. MRI brain, MRA head and neck, TTE.  3. BP management.  4. Should start a BP diary at home with 3x per day readings from automated BP cuff, for review by his PCP.  5. Start ASA 81 mg po qd.  6. PT/OT/Speech.  7. Fasting lipid panel and HgbA1c.   Electronically signed: Dr. Caryl Pina 06/02/2016, 7:33 PM

## 2016-06-03 ENCOUNTER — Encounter (HOSPITAL_COMMUNITY): Payer: Self-pay | Admitting: *Deleted

## 2016-06-03 ENCOUNTER — Observation Stay (HOSPITAL_BASED_OUTPATIENT_CLINIC_OR_DEPARTMENT_OTHER): Payer: BLUE CROSS/BLUE SHIELD

## 2016-06-03 ENCOUNTER — Observation Stay (HOSPITAL_COMMUNITY): Payer: BLUE CROSS/BLUE SHIELD

## 2016-06-03 DIAGNOSIS — I160 Hypertensive urgency: Secondary | ICD-10-CM | POA: Diagnosis not present

## 2016-06-03 DIAGNOSIS — G459 Transient cerebral ischemic attack, unspecified: Secondary | ICD-10-CM

## 2016-06-03 LAB — VAS US CAROTID
LCCADDIAS: -29 cm/s
LCCADSYS: -95 cm/s
LCCAPSYS: 109 cm/s
LEFT ECA DIAS: -19 cm/s
LEFT VERTEBRAL DIAS: 19 cm/s
LICAPSYS: -74 cm/s
Left CCA prox dias: 27 cm/s
Left ICA dist dias: -47 cm/s
Left ICA dist sys: -102 cm/s
Left ICA prox dias: -32 cm/s
RCCADSYS: -78 cm/s
RCCAPSYS: -101 cm/s
RIGHT ECA DIAS: -33 cm/s
RIGHT VERTEBRAL DIAS: -8 cm/s
Right CCA prox dias: -17 cm/s

## 2016-06-03 LAB — ECHOCARDIOGRAM COMPLETE
Height: 72 in
WEIGHTICAEL: 4204.8 [oz_av]

## 2016-06-03 LAB — LIPID PANEL
CHOL/HDL RATIO: 6.2 ratio
CHOLESTEROL: 230 mg/dL — AB (ref 0–200)
HDL: 37 mg/dL — ABNORMAL LOW (ref >40)
LDL Cholesterol: 149 mg/dL — ABNORMAL HIGH (ref 0–99)
Triglycerides: 221 mg/dL — ABNORMAL HIGH (ref <150)
VLDL: 44 mg/dL — ABNORMAL HIGH (ref 0–40)

## 2016-06-03 MED ORDER — ATORVASTATIN CALCIUM 40 MG PO TABS: 40.0000 mg | ORAL_TABLET | Freq: Every day | ORAL | 0 refills | Status: DC

## 2016-06-03 MED ORDER — LORAZEPAM 2 MG/ML IJ SOLN
1.0000 mg | Freq: Once | INTRAMUSCULAR | Status: AC
  Administered 2016-06-03: 1 mg via INTRAVENOUS
  Filled 2016-06-03: qty 1

## 2016-06-03 MED ORDER — ATORVASTATIN CALCIUM 40 MG PO TABS: 40.0000 mg | ORAL_TABLET | Freq: Every day | ORAL | Status: DC

## 2016-06-03 MED ORDER — AMPHET-DEXTROAMPHET 3-BEAD ER 37.5 MG PO CP24: 1.0000 | ORAL_CAPSULE | Freq: Every day | ORAL | Status: DC

## 2016-06-03 MED ORDER — INFLUENZA VAC SPLIT QUAD 0.5 ML IM SUSY: 0.5000 mL | PREFILLED_SYRINGE | INTRAMUSCULAR | Status: DC

## 2016-06-03 MED ORDER — ASPIRIN 81 MG PO TBEC: 81.0000 mg | DELAYED_RELEASE_TABLET | Freq: Every day | ORAL | 0 refills | Status: AC

## 2016-06-03 NOTE — Progress Notes (Signed)
STROKE TEAM PROGRESS NOTE   HISTORY OF PRESENT ILLNESS (per record) Brett Hester is an 56 y.o. male who presented from home after acute onset of combined presyncopal sensation with vertigo, "head pressure" sensation, left facial tingling and bilateral tongue paresthesias.Symptom onset was at 4:15 PM while cleaning Brett house. Per ED staff, on arrival he stated  "I still feel pressure in the back of my head and feel a little dizzy. He was able to ambulate to the restroom without difficulty.   Denies vision changes, weakness, neck pain, chest pain, abdominal pain, diarrhea, SOB or limb pain.    SUBJECTIVE (INTERVAL HISTORY) Brett Hester is at the bedside.  Overall he feels Brett condition is completely resolved. He recounted HPI with me. He had left facial tingling and felt panic which was persistent. He called EMS and on arrival Brett BP was high. He was sent to ER for evaluation. He recently started addrell for ADHD and he worries about Brett BP. He is not taking BP meds at home but BP check at home is always good. He suppose to take lipitor but not started yet. MRI negative.   OBJECTIVE Temp:  [97.5 F (36.4 C)-98.7 F (37.1 C)] 97.5 F (36.4 C) (01/14 1023) Pulse Rate:  [66-82] 76 (01/14 1023) Cardiac Rhythm: Normal sinus rhythm (01/14 0700) Resp:  [10-25] 19 (01/14 1023) BP: (114-154)/(69-91) 142/89 (01/14 1023) SpO2:  [95 %-100 %] 98 % (01/14 1023) Weight:  [119.2 kg (262 lb 12.8 oz)-120.2 kg (265 lb)] 119.2 kg (262 lb 12.8 oz) (01/13 2345)  CBC:   Recent Labs Lab 06/02/16 1904 06/02/16 1912  WBC 8.0  --   NEUTROABS 4.9  --   HGB 15.1 15.3  HCT 45.9 45.0  MCV 92.0  --   PLT 208  --     Basic Metabolic Panel:   Recent Labs Lab 06/02/16 1904 06/02/16 1912  NA 140 141  K 4.8 4.1  CL 104 102  CO2 24  --   GLUCOSE 88 88  BUN 13 15  CREATININE 1.22 1.20  CALCIUM 10.0  --     Lipid Panel:     Component Value Date/Time   CHOL 230 (H) 06/03/2016 0502   TRIG 221 (H)  06/03/2016 0502   HDL 37 (L) 06/03/2016 0502   CHOLHDL 6.2 06/03/2016 0502   VLDL 44 (H) 06/03/2016 0502   LDLCALC 149 (H) 06/03/2016 0502   HgbA1c: No results found for: HGBA1C Urine Drug Screen:     Component Value Date/Time   LABOPIA NONE DETECTED 06/02/2016 1922   COCAINSCRNUR NONE DETECTED 06/02/2016 1922   LABBENZ NONE DETECTED 06/02/2016 1922   AMPHETMU POSITIVE (A) 06/02/2016 1922   THCU NONE DETECTED 06/02/2016 1922   LABBARB NONE DETECTED 06/02/2016 1922      IMAGING  I have personally reviewed the radiological images below and agree with the radiology interpretations.  Brett Hester Head/brain Wo Cm 06/03/2016 1. No acute intracranial abnormality.  Normal brain MRI for age.  2. Normal intracranial MRA.   Ct Head Code Stroke Wo Contrast 06/02/2016 1. No acute intracranial findings. Mild chronic microvascular ischemic changes suspected.  2. ASPECTS is 10.   CUS - 1-39% ICA plaquing. Vertebral artery flow is antegrade.   TTE - - Left ventricle: The cavity size was normal. Systolic function was   normal. The estimated ejection fraction was in the range of 55%   to 60%. Wall motion was normal; there were no regional wall   motion abnormalities.  Doppler parameters are consistent with   abnormal left ventricular relaxation (grade 1 diastolic   dysfunction). There was no evidence of elevated ventricular   filling pressure by Doppler parameters. - Aortic valve: Trileaflet; normal thickness leaflets. There was no   regurgitation. - Aortic root: The aortic root was normal in size. - Ascending aorta: The ascending aorta was normal in size. - Mitral valve: Structurally normal valve. There was no   regurgitation. - Right ventricle: The cavity size was normal. Wall thickness was   normal. Systolic function was normal. - Tricuspid valve: There was trivial regurgitation. - Pulmonic valve: There was no regurgitation. - Pulmonary arteries: Systolic pressure was within the normal    range. - Inferior vena cava: The vessel was normal in size. - Pericardium, extracardiac: There was no pericardial effusion.   PHYSICAL EXAM  Temp:  [97.3 F (36.3 C)-98.7 F (37.1 C)] 97.6 F (36.4 C) (01/14 1200) Pulse Rate:  [66-82] 80 (01/14 1200) Resp:  [10-25] 18 (01/14 1200) BP: (114-154)/(69-92) 148/92 (01/14 1200) SpO2:  [95 %-100 %] 98 % (01/14 1200) Weight:  [262 lb 12.8 oz (119.2 kg)-265 lb (120.2 kg)] 262 lb 12.8 oz (119.2 kg) (01/13 2345)  General - Well nourished, well developed, in no apparent distress.  Ophthalmologic - Sharp disc margins OU.   Cardiovascular - Regular rate and rhythm.  Mental Status -  Level of arousal and orientation to time, place, and person were intact. Language including expression, naming, repetition, comprehension was assessed and found intact. Fund of Knowledge was assessed and was intact.  Cranial Nerves II - XII - II - Visual field intact OU. III, IV, VI - Extraocular movements intact. V - Facial sensation intact bilaterally. VII - Facial movement intact bilaterally. VIII - Hearing & vestibular intact bilaterally. X - Palate elevates symmetrically. XI - Chin turning & shoulder shrug intact bilaterally. XII - Tongue protrusion intact.  Motor Strength - The patient's strength was normal in all extremities and pronator drift was absent.  Bulk was normal and fasciculations were absent.   Motor Tone - Muscle tone was assessed at the neck and appendages and was normal.  Reflexes - The patient's reflexes were 1+ in all extremities and he had no pathological reflexes.  Sensory - Light touch, temperature/pinprick were assessed and were symmetrical.    Coordination - The patient had normal movements in the hands and feet with no ataxia or dysmetria.  Tremor was absent.  Gait and Station - The patient's transfers, posture, gait, station, and turns were observed as normal.   ASSESSMENT/PLAN Brett Hester is a 56 y.o. male with  history of hypertension, anxiety, and ADHD presenting with acute onset of left facial tingling and head pressure feeling. He did not receive IV t-PA due to Mild deficits.  Anxiety / panic attack   Resultant  Deficit resolved  MRI -  No acute intracranial abnormality.  Normal brain MRI for age.   MRA - Normal  Carotid Doppler - 1-39% ICA plaquing. Vertebral artery flow is antegrade.   2D Echo - EF 55-60%  LDL - 149 (cholesterol 230)  HgbA1c - pending  VTE prophylaxis - Lovenox Diet Heart Room service appropriate? Yes; Fluid consistency: Thin  No antithrombotic prior to admission, now on aspirin 81 mg daily. Continue ASA 81mg  on discharge.  Patient counseled to be compliant with Brett antithrombotic medications  Ongoing aggressive stroke risk factor management  Disposition:  home  Hypertension  Stable  BP goal normotensive  Check BP  at home  Hyperlipidemia  Home meds: No lipid lowering medications prior to admission.  LDL 149, goal < 70  Now on atorvastatin 40 mg daily  Continue statin at discharge  Other Stroke Risk Factors  Obesity, Body mass index is 35.64 kg/m., recommend weight loss, diet and exercise as appropriate   Other Active Problems  ADHD - on addrell    Hospital day # 0  Neurology will sign off. Please call with questions. No neuro follow up needed at this time. Thanks for the consult.  Marvel PlanJindong Donn Wilmot, MD PhD Stroke Neurology 06/03/2016 2:29 PM  To contact Stroke Continuity provider, please refer to [email or web address]. After hours, contact General Neurology

## 2016-06-03 NOTE — Progress Notes (Signed)
OT Cancellation Note  Patient Details Name: Brett RowerDaniel O Hester MRN: [id number] DOB: 02/20/1961   Cancelled Treatment:    Reason Eval/Treat Not Completed: Patient at procedure or test/ unavailable. Will follow up for OT eval as time allows.  Gaye AlkenBailey A Diego Delancey M.S., OTR/L Pager: [phone]  06/03/2016, 11:46 AM

## 2016-06-03 NOTE — Progress Notes (Signed)
Echocardiogram 2D Echocardiogram has been performed.  Brett Hester, Alianys Chacko M 06/03/2016, 11:47 AM

## 2016-06-03 NOTE — Progress Notes (Signed)
VASCULAR LAB PRELIMINARY  PRELIMINARY  PRELIMINARY  PRELIMINARY  Carotid duplex completed.    Preliminary report:  1-39% ICA plaquing. Vertebral artery flow is antegrade.   Jacobi Nile, RVT 06/03/2016, 12:51 PM

## 2016-06-03 NOTE — Progress Notes (Signed)
Patient arrived to unit via ED staff with wife and belongings at bedside. Vitals stable, tele applied and verified.  Patient oriented to room/unit, questions answered. Admission completed. Continue to monitor.

## 2016-06-03 NOTE — Progress Notes (Signed)
OT Cancellation Note  Patient Details Name: Brett RowerDaniel O Hester MRN: [id number] DOB: 03/28/1961   Cancelled Treatment:    Reason Eval/Treat Not Completed: OT screened, no needs identified, will sign off. Per PT, pt independent with mobility and has returned to functional baseline. No acute OT needs identified; will screen and sign off. Please re-consult if needs change. Thank you for this referral.  Gaye AlkenBailey A Ghislaine Harcum M.S., OTR/L Pager: [phone]  06/03/2016, 2:16 PM

## 2016-06-03 NOTE — Evaluation (Signed)
Physical Therapy Evaluation Patient Details Name: Brett Hester MRN: [id number] DOB: 1960/11/28 Today's Date: 06/03/2016   History of Present Illness  Patient is a 56 yo male admitted 06/02/16 with hypertensive urgency and Lt facial numbness, HA, and dizziness.   Brain MRI negative.  Symptoms resolved.   PMH:  HTN, Anxiety, ADHD  Clinical Impression  Patient is functioning independently with mobility and gait.  Scored 23/24 on DGI balance assessment indicating low fall risk.  Symptoms resolved and patient at baseline.  No PT needs identified - PT will sign off.    Follow Up Recommendations No PT follow up    Equipment Recommendations  None recommended by PT    Recommendations for Other Services       Precautions / Restrictions Precautions Precautions: None Restrictions Weight Bearing Restrictions: No      Mobility  Bed Mobility               General bed mobility comments: Patient in chair  Transfers Overall transfer level: Independent Equipment used: None                Ambulation/Gait Ambulation/Gait assistance: Independent Ambulation Distance (Feet): 200 Feet Assistive device: None Gait Pattern/deviations: WFL(Within Functional Limits)   Gait velocity interpretation: at or above normal speed for age/gender General Gait Details: Patient with good gait pattern, balance, and speed.  Stairs Stairs: Yes Stairs assistance: Supervision Stair Management: No rails;Alternating pattern;Forwards Number of Stairs: 4 General stair comments: Patient with minimal touch to wall while descending stairs due to old Lt knee injury.  Wheelchair Mobility    Modified Rankin (Stroke Patients Only) Modified Rankin (Stroke Patients Only) Pre-Morbid Rankin Score: No symptoms Modified Rankin: No symptoms     Balance Overall balance assessment: Independent                               Standardized Balance Assessment Standardized Balance Assessment :  Dynamic Gait Index   Dynamic Gait Index Level Surface: Normal Change in Gait Speed: Normal Gait with Horizontal Head Turns: Normal Gait with Vertical Head Turns: Normal Gait and Pivot Turn: Normal Step Over Obstacle: Normal Step Around Obstacles: Normal Steps: Mild Impairment Total Score: 23       Pertinent Vitals/Pain Pain Assessment: No/denies pain    Home Living Family/patient expects to be discharged to:: Private residence Living Arrangements: Spouse/significant other;Children Available Help at Discharge: Family;Available 24 hours/day Type of Home: House Home Access: Stairs to enter   [employer] of Steps: 2 Home Layout: Two level;Bed/bath upstairs Home Equipment: None      Prior Function Level of Independence: Independent         Comments: Works as Dance movement psychotherapist   Dominant Hand: Right    Extremity/Trunk Assessment   Upper Extremity Assessment Upper Extremity Assessment: Overall WFL for tasks assessed    Lower Extremity Assessment Lower Extremity Assessment: Overall WFL for tasks assessed (Chronic Lt knee deficits/injury)    Cervical / Trunk Assessment Cervical / Trunk Assessment: Normal  Communication   Communication: No difficulties  Cognition Arousal/Alertness: Awake/alert Behavior During Therapy: WFL for tasks assessed/performed Overall Cognitive Status: Within Functional Limits for tasks assessed                      General Comments      Exercises     Assessment/Plan    PT Assessment Patent does not need  any further PT services  PT Problem List            PT Treatment Interventions      PT Goals (Current goals can be found in the Care Plan section)  Acute Rehab PT Goals PT Goal Formulation: All assessment and education complete, DC therapy    Frequency     Barriers to discharge        Co-evaluation               End of Session Equipment Utilized During Treatment: Gait  belt Activity Tolerance: Patient tolerated treatment well Patient left: in chair;with call bell/phone within reach;with nursing/sitter in room;with family/visitor present Nurse Communication: Mobility status (No PT needs)    Functional Assessment Tool Used: Clinical judgement Functional Limitation: Mobility: Walking and moving around Mobility: Walking and Moving Around Current Status [phone](G8978): 0 percent impaired, limited or restricted Mobility: Walking and Moving Around Goal Status [phone](G8979): 0 percent impaired, limited or restricted Mobility: Walking and Moving Around Discharge Status [phone](G8980): 0 percent impaired, limited or restricted    Time: 1351-1402 PT Time Calculation (min) (ACUTE ONLY): 11 min   Charges:   PT Evaluation $PT Eval Moderate Complexity: 1 Procedure     PT G Codes:   PT G-Codes **NOT FOR INPATIENT CLASS** Functional Assessment Tool Used: Clinical judgement Functional Limitation: Mobility: Walking and moving around Mobility: Walking and Moving Around Current Status ([id number](G8978): 0 percent impaired, limited or restricted Mobility: Walking and Moving Around Goal Status ([id number](G8979): 0 percent impaired, limited or restricted Mobility: Walking and Moving Around Discharge Status ([id number](G8980): 0 percent impaired, limited or restricted    Vena AustriaSusan H Audrielle Vankuren 06/03/2016, 2:10 PM Durenda HurtSusan H. Renaldo Fiddleravis, PT, [facility] 2M[facility] Acute Rehab Services Pager [phone]

## 2016-06-04 LAB — HEMOGLOBIN A1C
HEMOGLOBIN A1C: 5.9 % — AB (ref 4.8–5.6)
MEAN PLASMA GLUCOSE: 123 mg/dL

## 2016-06-12 NOTE — Discharge Summary (Signed)
[facility] Discharge Summary   Patient: Brett Hester WUJ:[id number]   PCP: Thora Lance, MD DOB: [birth year]   Date of admission: 06/02/2016   Date of discharge: 06/03/2016    Discharge Diagnoses:  Principal Problem:   Hypertensive urgency Active Problems:   TIA (transient ischemic attack)   Admitted From: home Disposition:  home  Recommendations for Outpatient Follow-up:  1. Follow-up with PCP in one week for blood pressure adjustment   Follow-up Information    Thora Lance, MD. Schedule an appointment as soon as possible for a visit in 1 week(s).   Specialty:  Family Medicine Contact information: [address] [place] [address] [phone]          Diet recommendation: Cardiac diet  Activity: The patient is advised to gradually reintroduce usual activities.  Discharge Condition: good  Code Status: Full code  History of present illness: As per the H and P dictated on admission, " Brett Hester is a 56 y.o. male with medical history significant of HTN, anxiety, ADHD.  Patient presents to the ED after sudden onset at 4:15PM today of L facial paresthesia, tongue numbness.  Symptoms lasted for about 10 seconds.  Had associated lightheadedness and sense of head "fullness" that lasted 45 mins and has been intermittent since then.  EMS arrived to house and SBP was in the 200s (took it twice he says)."  Hospital Course:   Summary of his active problems in the hospital is as following.  Principal Problem:   Hypertensive urgency Blood pressure was well controlled in the hospital. In fact no number in the hospital was elevated concerning for hypertensive urgency. No antihypertensive medication has been started in the hospital. Recommendations patient to follow-up with PCP.  Active Problems:   TIA (transient ischemic attack) Presented with left-sided tongue numbness and pedis face ER. MRI brain negative for any acute  stroke. Nephrology consulted. Started on as 81 mg aspirin and Lipitor 40. PTOT recommends no further therapy from their side. Cardiac count shows 54-60% EF with diastolic dysfunction without any WMA Carotid Doppler shows 1-39% plaque no significant stenosis.  All other chronic medical condition were stable during the hospitalization.  Patient was seen by physical therapy, who recommended no further therapy On the day of the discharge the patient's home, and no other acute medical condition were reported by patient. the patient was felt safe to be discharge at home with family.  Procedures and Results:  Echocardiogram Study Conclusions  - Left ventricle: The cavity size was normal. Systolic function was   normal. The estimated ejection fraction was in the range of 55%   to 60%. Wall motion was normal; there were no regional wall   motion abnormalities. Doppler parameters are consistent with   abnormal left ventricular relaxation (grade 1 diastolic   dysfunction). There was no evidence of elevated ventricular   filling pressure by Doppler parameters. - Aortic valve: Trileaflet; normal thickness leaflets. There was no   regurgitation. - Aortic root: The aortic root was normal in size. - Ascending aorta: The ascending aorta was normal in size. - Mitral valve: Structurally normal valve. There was no   regurgitation. - Right ventricle: The cavity size was normal. Wall thickness was   normal. Systolic function was normal. - Tricuspid valve: There was trivial regurgitation. - Pulmonic valve: There was no regurgitation. - Pulmonary arteries: Systolic pressure was within the normal   range. - Inferior vena cava: The vessel was normal in size. - Pericardium,  extracardiac: There was no pericardial effusion.    Carotid Doppler.  Summary: Bilateral: intimal wall thickening CCA. 1-39% ICA plaquing. Vertebral artery flow is antegrade.   Consultations:  Neurology  DISCHARGE  MEDICATION: Discharge Medication List as of 06/03/2016  1:58 PM    START taking these medications   Details  aspirin EC 81 MG EC tablet Take 1 tablet (81 mg total) by mouth daily., Starting Mon 06/04/2016, Normal    atorvastatin (LIPITOR) 40 MG tablet Take 1 tablet (40 mg total) by mouth daily at 6 PM., Starting Sun 06/03/2016, Normal      CONTINUE these medications which have NOT CHANGED   Details  ALPRAZolam (XANAX) 0.5 MG tablet Take 0.5 mg by mouth at bedtime as needed for anxiety., Historical Med    Amphet-Dextroamphet 3-Bead ER (MYDAYIS) 37.5 MG CP24 Take by mouth., Historical Med    RABEprazole (ACIPHEX) 20 MG tablet Take 20 mg by mouth daily., Historical Med       No Known Allergies Discharge Instructions    Diet - low sodium heart healthy    Complete by:  As directed    Discharge instructions    Complete by:  As directed    It is important that you read following instructions as well as go over your medication list with RN to help you understand your care after this hospitalization.  Discharge Instructions: Please follow-up with PCP in one week  Please request your primary care physician to go over all Hospital Tests and Procedure/Radiological results at the follow up,  Please get all Hospital records sent to your PCP by signing hospital release before you go home.   Do not take more than prescribed Pain, Sleep and Anxiety Medications. You were cared for by a hospitalist during your hospital stay. If you have any questions about your discharge medications or the care you received while you were in the hospital after you are discharged, you can call the unit and ask to speak with the hospitalist on call if the hospitalist that took care of you is not available.  Once you are discharged, your primary care physician will handle any further medical issues. Please note that NO REFILLS for any discharge medications will be authorized once you are discharged, as it is imperative  that you return to your primary care physician (or establish a relationship with a primary care physician if you do not have one) for your aftercare needs so that they can reassess your need for medications and monitor your lab values. You Must read complete instructions/literature along with all the possible adverse reactions/side effects for all the Medicines you take and that have been prescribed to you. Take any new Medicines after you have completely understood and accept all the possible adverse reactions/side effects. Wear Seat belts while driving. If you have smoked or chewed Tobacco in the last 2 yrs please stop smoking and/or stop any Recreational drug use.   Increase activity slowly    Complete by:  As directed      Discharge Exam: Filed Weights   06/02/16 1746 06/02/16 2345  Weight: 120.2 kg (265 lb) 119.2 kg (262 lb 12.8 oz)   Vitals:   06/03/16 1023 06/03/16 1200  BP: (!) 142/89 (!) 148/92  Pulse: 76 80  Resp: 19 18  Temp: 97.5 F (36.4 C) 97.6 F (36.4 C)   General: Appear in no distress, no Rash; Oral Mucosa moist. Cardiovascular: S1 and S2 Present, no Murmur, no JVD Respiratory: Bilateral Air entry present  and Clear to Auscultation, no Crackles, no wheezes Abdomen: Bowel Sound present, Soft and no tenderness Extremities: no Pedal edema, n calf tenderness Neurology: Grossly no focal neuro deficit.  The results of significant diagnostics from this hospitalization (including imaging, microbiology, ancillary and laboratory) are listed below for reference.    Significant Diagnostic Studies: Mr Brain Wo Contrast  Result Date: 06/03/2016 CLINICAL DATA:  Left facial paresthesia EXAM: MRI HEAD WITHOUT CONTRAST MRA HEAD WITHOUT CONTRAST TECHNIQUE: Multiplanar, multiecho pulse sequences of the brain and surrounding structures were obtained without intravenous contrast. Angiographic images of the head were obtained using MRA technique without contrast. COMPARISON:  Head CT  06/02/2016 FINDINGS: MRI HEAD FINDINGS Brain: No focal diffusion restriction to indicate acute infarct. No intraparenchymal hemorrhage. There is minimal hyperintense T2-weighted signal within the periventricular white matter, which may be seen in normal patients of this age. No mass lesion or midline shift. No hydrocephalus or extra-axial fluid collection. The midline structures are normal. No age advanced or lobar predominant atrophy. Vascular: Major intracranial arterial and venous sinus flow voids are preserved. No evidence of chronic microhemorrhage or amyloid angiopathy. Skull and upper cervical spine: The visualized skull base, calvarium, upper cervical spine and extracranial soft tissues are normal. Sinuses/Orbits: No fluid levels or advanced mucosal thickening. No mastoid effusion. Normal orbits. MRA HEAD FINDINGS Intracranial internal carotid arteries: Normal. Anterior cerebral arteries: Normal. Middle cerebral arteries: Normal. Posterior communicating arteries: Present on the right. Posterior cerebral arteries: Fetal origin of the right PCA. Normal left PCA. Basilar artery: Normal. Vertebral arteries: Left dominant. The right vertebral artery terminates in the right PICA. Superior cerebellar arteries: Normal. Anterior inferior cerebellar arteries: Not clearly visualized, which is not uncommon. Posterior inferior cerebellar arteries: Normal. IMPRESSION: 1. No acute intracranial abnormality.  Normal brain MRI for age. 2. Normal intracranial MRA. Electronically Signed   By: Deatra RobinsonKevin  Herman M.D.   On: 06/03/2016 06:14   Mr Maxine GlennMra Head/brain ZOWo Cm  Result Date: 06/03/2016 CLINICAL DATA:  Left facial paresthesia EXAM: MRI HEAD WITHOUT CONTRAST MRA HEAD WITHOUT CONTRAST TECHNIQUE: Multiplanar, multiecho pulse sequences of the brain and surrounding structures were obtained without intravenous contrast. Angiographic images of the head were obtained using MRA technique without contrast. COMPARISON:  Head CT  06/02/2016 FINDINGS: MRI HEAD FINDINGS Brain: No focal diffusion restriction to indicate acute infarct. No intraparenchymal hemorrhage. There is minimal hyperintense T2-weighted signal within the periventricular white matter, which may be seen in normal patients of this age. No mass lesion or midline shift. No hydrocephalus or extra-axial fluid collection. The midline structures are normal. No age advanced or lobar predominant atrophy. Vascular: Major intracranial arterial and venous sinus flow voids are preserved. No evidence of chronic microhemorrhage or amyloid angiopathy. Skull and upper cervical spine: The visualized skull base, calvarium, upper cervical spine and extracranial soft tissues are normal. Sinuses/Orbits: No fluid levels or advanced mucosal thickening. No mastoid effusion. Normal orbits. MRA HEAD FINDINGS Intracranial internal carotid arteries: Normal. Anterior cerebral arteries: Normal. Middle cerebral arteries: Normal. Posterior communicating arteries: Present on the right. Posterior cerebral arteries: Fetal origin of the right PCA. Normal left PCA. Basilar artery: Normal. Vertebral arteries: Left dominant. The right vertebral artery terminates in the right PICA. Superior cerebellar arteries: Normal. Anterior inferior cerebellar arteries: Not clearly visualized, which is not uncommon. Posterior inferior cerebellar arteries: Normal. IMPRESSION: 1. No acute intracranial abnormality.  Normal brain MRI for age. 2. Normal intracranial MRA. Electronically Signed   By: Deatra RobinsonKevin  Herman M.D.   On: 06/03/2016 06:14  Ct Head Code Stroke Wo Contrast  Result Date: 06/02/2016 CLINICAL DATA:  Code stroke. Dizziness with LEFT facial and tongue numbness beginning earlier today. EXAM: CT HEAD WITHOUT CONTRAST TECHNIQUE: Contiguous axial images were obtained from the base of the skull through the vertex without intravenous contrast. COMPARISON:  None. FINDINGS: Brain: No evidence of acute infarction, hemorrhage,  hydrocephalus, extra-axial collection or mass lesion/mass effect. Normal cerebral volume. Mild hypoattenuation of white matter could represent early chronic microvascular ischemic change. Vascular: No hyperdense vessel or unexpected calcification. Skull: Normal. Negative for fracture or focal lesion. Sinuses/Orbits: No acute finding. Other: None. ASPECTS [facility] Stroke Program Early CT Score) - Ganglionic level infarction (caudate, lentiform nuclei, internal capsule, insula, M1-M3 cortex): 7 - Supraganglionic infarction (M4-M6 cortex): 3 Total score (0-10 with 10 being normal): 10 IMPRESSION: 1. No acute intracranial findings. Mild chronic microvascular ischemic changes suspected. 2. ASPECTS is 10. A call was placed to the stroke neurologist at 7:15 p.m., 06/02/2016. Electronically Signed   By: Elsie Stain M.D.   On: 06/02/2016 19:19   Time spent: 30 minutes  Signed:  Lynden Oxford  [facility] 06/03/2016 , 11:30 AM

## 2016-09-03 ENCOUNTER — Encounter: Payer: Self-pay | Admitting: Endocrinology

## 2016-09-03 ENCOUNTER — Ambulatory Visit (INDEPENDENT_AMBULATORY_CARE_PROVIDER_SITE_OTHER): Payer: BLUE CROSS/BLUE SHIELD | Admitting: Endocrinology

## 2016-09-03 DIAGNOSIS — E291 Testicular hypofunction: Secondary | ICD-10-CM | POA: Diagnosis not present

## 2016-09-03 NOTE — Progress Notes (Signed)
Subjective:    Patient ID: Brett Hester, male    DOB: 1960/11/30, 56 y.o.   MRN: [id number]  HPI Pt is referred by Dr Manus Gunning, for hypogonadism.  Pt reports he had puberty at the normal age.  He has 3 biological children.  He has had vasectomy.  He says he has never taken illicit androgens.  He has never been on any prescribed medication for hypogonadism.  He does not take antiandrogens or opioids.  He denies any h/o infertility, XRT, or genital infection.  He has never had surgery, or a serious injury to the head or genital area. He has no h/o sleep apnea or DVT (sleep result is pending).   He does not consume alcohol excessively.  He has moderate anxiety and assoc fatigue.   Past Medical History:  Diagnosis Date  . ADHD   . Anxiety   . Hypertension     No past surgical history on file.  Social History   Social History  . Marital status: Married    Spouse name: N/A  . Number of children: N/A  . Years of education: N/A   Occupational History  . Not on file.   Social History Main Topics  . Smoking status: Never Smoker  . Smokeless tobacco: Never Used  . Alcohol use No  . Drug use: No  . Sexual activity: Not on file   Other Topics Concern  . Not on file   Social History Narrative  . No narrative on file    Current Outpatient Prescriptions on File Prior to Visit  Medication Sig Dispense Refill  . ALPRAZolam (XANAX) 0.5 MG tablet Take 0.5 mg by mouth at bedtime as needed for anxiety.    . Amphet-Dextroamphet 3-Bead ER (MYDAYIS) 37.5 MG CP24 Take by mouth.    Marland Kitchen aspirin EC 81 MG EC tablet Take 1 tablet (81 mg total) by mouth daily. 90 tablet 0  . atorvastatin (LIPITOR) 40 MG tablet Take 1 tablet (40 mg total) by mouth daily at 6 PM. 90 tablet 0  . RABEprazole (ACIPHEX) 20 MG tablet Take 20 mg by mouth daily.     No current facility-administered medications on file prior to visit.     No Known Allergies  Family History  Problem Relation Age of Onset  . Other Neg  Hx     hypogonadism   BP 130/80 (BP Location: Left Arm, Cuff Size: Large)   Pulse 85   Resp 16   Ht  (1.854 m)   Wt 269 lb 9.6 oz (122.3 kg)   SpO2 96%   BMI 35.57 kg/m   Review of Systems denies numbness, decreased urinary stream, fever, headache, easy bruising, blurry vision, rhinorrhea, chest pain.  He has difficulty with concentration, ED sxs, decreased libido, weight gain, breast swelling, doe, decreased muscle strength, leg swelling, dry skin, and myalgias.     Objective:   Physical Exam VS: see vs page GEN: no distress HEAD: head: no deformity eyes: no periorbital swelling, no proptosis.  external nose and ears are normal mouth: no lesion seen NECK: supple, thyroid is not enlarged CHEST WALL: no deformity LUNGS: clear to auscultation BREASTS: slight bilat gynecomastia or pseudogynecomastia.   CV: reg rate and rhythm, no murmur ABD: abdomen is soft, nontender.  no hepatosplenomegaly.  not distended.  no hernia GENITALIA:  Normal male.   MUSCULOSKELETAL: muscle bulk and strength are grossly normal.  no obvious joint swelling.  gait is normal and steady.  EXTEMITIES: no deformity.  no ulcer on the feet.  feet are of normal color and temp.  1+ bilat leg edema.   PULSES: dorsalis pedis intact bilat.  no carotid bruit NEURO:  cn 2-12 grossly intact.   readily moves all 4's.  sensation is intact to touch on the feet SKIN:  Normal texture and temperature.  No rash or suspicious lesion is visible.  Normal hair distribution.  NODES:  None palpable at the neck.   PSYCH: alert, well-oriented.  Does not appear anxious nor depressed.    MRI: no mention is made of the pituitary.    outside test results are reviewed: Testosterone=268 FSH=1.6 LH=3.3 Hb=15.4 TSH=1.1 PSA=0.89  We have requested records from [facility]    Assessment & Plan:  Hypogonadism, new, central, uncertain etiology. Gynecomastia, mild.    Patient Instructions  Testosterone treatment has risks,  including increased or decreased fertility (depending on the type of treatment), hair loss, prostate cancer, benign prostate enlargement, blood clots, liver problems, lower hdl ("good cholesterol"), polycythemia (opposite of anemia), sleep apnea, and behavior changes. Weight loss helps the testosterone, also.  blood tests are requested for you today.  We'll let you know about the results.  Based on the results, I would prescribe for you tamoxifen or clomiphene.  Please come back for a follow-up appointment in 4-6 weeks.

## 2016-09-03 NOTE — Patient Instructions (Addendum)
Testosterone treatment has risks, including increased or decreased fertility (depending on the type of treatment), hair loss, prostate cancer, benign prostate enlargement, blood clots, liver problems, lower hdl ("good cholesterol"), polycythemia (opposite of anemia), sleep apnea, and behavior changes. Weight loss helps the testosterone, also.  blood tests are requested for you today.  We'll let you know about the results.  Based on the results, I would prescribe for you tamoxifen or clomiphene.  Please come back for a follow-up appointment in 4-6 weeks.

## 2016-09-04 LAB — PROLACTIN: Prolactin: 7.2 ng/mL (ref 2.0–18.0)

## 2016-09-04 LAB — TESTOSTERONE,FREE AND TOTAL
TESTOSTERONE FREE: 6.5 pg/mL — AB (ref 7.2–24.0)
TESTOSTERONE: 281 ng/dL (ref 264–916)

## 2016-09-10 LAB — ESTRADIOL, FREE
ESTRADIOL FREE: 0.68 pg/mL — AB (ref <=0.45)
Estradiol: 33 pg/mL — ABNORMAL HIGH (ref <=29)

## 2016-09-11 ENCOUNTER — Other Ambulatory Visit: Payer: Self-pay | Admitting: Endocrinology

## 2016-09-11 MED ORDER — TAMOXIFEN CITRATE 10 MG PO TABS: 10.0000 mg | ORAL_TABLET | Freq: Every day | ORAL | 11 refills | Status: DC

## 2016-10-01 ENCOUNTER — Ambulatory Visit: Payer: BLUE CROSS/BLUE SHIELD | Admitting: Endocrinology

## 2016-10-01 DIAGNOSIS — Z0289 Encounter for other administrative examinations: Secondary | ICD-10-CM

## 2016-10-02 ENCOUNTER — Telehealth: Payer: Self-pay | Admitting: Endocrinology

## 2016-10-02 NOTE — Telephone Encounter (Signed)
Patient no showed today's appt. Please advise on how to follow up. °A. No follow up necessary. °B. Follow up urgent. Contact patient immediately. °C. Follow up necessary. Contact patient and schedule visit in ___ days. °D. Follow up advised. Contact patient and schedule visit in ____weeks. ° °

## 2016-10-02 NOTE — Telephone Encounter (Signed)
No follow up necessary.  

## 2016-10-18 ENCOUNTER — Encounter: Payer: Self-pay | Admitting: Internal Medicine

## 2017-04-02 ENCOUNTER — Ambulatory Visit: Payer: BLUE CROSS/BLUE SHIELD | Admitting: Registered"

## 2018-01-08 ENCOUNTER — Ambulatory Visit: Payer: Self-pay | Admitting: Allergy & Immunology

## 2021-05-31 ENCOUNTER — Encounter (HOSPITAL_BASED_OUTPATIENT_CLINIC_OR_DEPARTMENT_OTHER): Payer: Self-pay | Admitting: Emergency Medicine

## 2021-05-31 ENCOUNTER — Other Ambulatory Visit: Payer: Self-pay

## 2021-05-31 ENCOUNTER — Emergency Department (HOSPITAL_BASED_OUTPATIENT_CLINIC_OR_DEPARTMENT_OTHER)
Admission: EM | Admit: 2021-05-31 | Discharge: 2021-05-31 | Disposition: A | Discharge status: Home / Self Care | Payer: No Typology Code available for payment source | Attending: Emergency Medicine | Admitting: Emergency Medicine

## 2021-05-31 ENCOUNTER — Emergency Department (HOSPITAL_BASED_OUTPATIENT_CLINIC_OR_DEPARTMENT_OTHER): Payer: No Typology Code available for payment source | Admitting: Radiology

## 2021-05-31 DIAGNOSIS — R1013 Epigastric pain: Secondary | ICD-10-CM | POA: Insufficient documentation

## 2021-05-31 DIAGNOSIS — R0602 Shortness of breath: Secondary | ICD-10-CM | POA: Diagnosis not present

## 2021-05-31 DIAGNOSIS — K2100 Gastro-esophageal reflux disease with esophagitis, without bleeding: Secondary | ICD-10-CM

## 2021-05-31 MED ORDER — SUCRALFATE 1 GM/10ML PO SUSP
1.0000 g | Freq: Once | ORAL | Status: AC
  Administered 2021-05-31: 1 g via ORAL
  Filled 2021-05-31: qty 10

## 2021-05-31 MED ORDER — SUCRALFATE 1 G PO TABS: 1.0000 g | ORAL_TABLET | Freq: Once | ORAL | Status: DC

## 2021-05-31 MED ORDER — ALUM & MAG HYDROXIDE-SIMETH 200-200-20 MG/5ML PO SUSP
30.0000 mL | Freq: Once | ORAL | Status: AC
  Administered 2021-05-31: 30 mL via ORAL
  Filled 2021-05-31: qty 30

## 2021-05-31 MED ORDER — RABEPRAZOLE SODIUM 20 MG PO TBEC: 20.0000 mg | DELAYED_RELEASE_TABLET | Freq: Every day | ORAL | 0 refills | Status: DC

## 2021-05-31 MED ORDER — LIDOCAINE VISCOUS HCL 2 % MT SOLN
15.0000 mL | Freq: Once | OROMUCOSAL | Status: AC
  Administered 2021-05-31: 15 mL via ORAL
  Filled 2021-05-31: qty 15

## 2021-05-31 NOTE — ED Provider Notes (Signed)
[facility] EMERGENCY DEPT Provider Note   CSN: [id number] Arrival date & time: 05/31/21  1836     History  Chief Complaint  Patient presents with   Abdominal Pain    Brett Hester is a 61 y.o. male.  61 year old male presents with epigastric pain rating to his chest that began after he ate a hamburger.  Has known history of reflux as well as eosinophilic esophagitis.  States he threw up immediately after this.  Since that has had a burning-like sensation.  States he can control her secretions.  He is able to swallow liquids but that does make his symptoms worse.  Used his over-the-counter medications without relief      Home Medications Prior to Admission medications   Medication Sig Start Date End Date Taking? Authorizing Provider  ALPRAZolam Prudy Feeler) 0.5 MG tablet Take 0.5 mg by mouth at bedtime as needed for anxiety.    [provider]  Amphet-Dextroamphet 3-Bead ER (MYDAYIS) 37.5 MG CP24 Take by mouth.    [provider]  aspirin EC 81 MG EC tablet Take 1 tablet (81 mg total) by mouth daily. 06/04/16   Rolly Salter, MD  atorvastatin (LIPITOR) 40 MG tablet Take 1 tablet (40 mg total) by mouth daily at 6 PM. 06/03/16   Rolly Salter, MD  RABEprazole (ACIPHEX) 20 MG tablet Take 20 mg by mouth daily.    [provider]  tamoxifen (NOLVADEX) 10 MG tablet Take 1 tablet (10 mg total) by mouth daily. 09/11/16   Romero Belling, MD      Allergies    Patient has no known allergies.    Review of Systems   Review of Systems  All other systems reviewed and are negative.  Physical Exam Updated Vital Signs BP (!) 189/106    Pulse 86    Temp 97.7 F (36.5 C)    Resp 15    Ht 1.829 m (6')    Wt 128.8 kg    SpO2 98%    BMI 38.52 kg/m  Physical Exam Vitals and nursing note reviewed.  Constitutional:      General: He is not in acute distress.    Appearance: Normal appearance. He is well-developed. He is not toxic-appearing.  HENT:      Head: Normocephalic and atraumatic.  Eyes:     General: Lids are normal.     Conjunctiva/sclera: Conjunctivae normal.     Pupils: Pupils are equal, round, and reactive to light.  Neck:     Thyroid: No thyroid mass.     Trachea: No tracheal deviation.  Cardiovascular:     Rate and Rhythm: Normal rate and regular rhythm.     Heart sounds: Normal heart sounds. No murmur heard.   No gallop.  Pulmonary:     Effort: Pulmonary effort is normal. No respiratory distress.     Breath sounds: Normal breath sounds. No stridor. No decreased breath sounds, wheezing, rhonchi or rales.  Abdominal:     General: There is no distension.     Palpations: Abdomen is soft.     Tenderness: There is abdominal tenderness in the epigastric area. There is no rebound.  Musculoskeletal:        General: No tenderness. Normal range of motion.     Cervical back: Normal range of motion and neck supple.  Skin:    General: Skin is warm and dry.     Findings: No abrasion or rash.  Neurological:     Mental Status:  He is alert and oriented to person, place, and time. Mental status is at baseline.     GCS: GCS eye subscore is 4. GCS verbal subscore is 5. GCS motor subscore is 6.     Cranial Nerves: No cranial nerve deficit.     Sensory: No sensory deficit.     Motor: Motor function is intact.  Psychiatric:        Attention and Perception: Attention normal.        Speech: Speech normal.        Behavior: Behavior normal.    ED Results / Procedures / Treatments   Labs (all labs ordered are listed, but only abnormal results are displayed) Labs Reviewed - No data to display  EKG EKG Interpretation  Date/Time:  Wednesday May 31 2021 19:01:09 EST Ventricular Rate:  92 PR Interval:  162 QRS Duration: 96 QT Interval:  368 QTC Calculation: 455 R Axis:   -27 Text Interpretation: Normal sinus rhythm Minimal voltage criteria for LVH, may be normal variant ( R in aVL ) Borderline ECG When compared with ECG of  02-Jun-2016 18:08, PREVIOUS ECG IS PRESENT No significant change since last tracing Confirmed by Lorre Nick ([id number]) on 05/31/2021 7:41:28 PM  Radiology No results found.  Procedures Procedures    Medications Ordered in ED Medications  lidocaine (XYLOCAINE) 2 % viscous mouth solution 15 mL (has no administration in time range)  alum & mag hydroxide-simeth (MAALOX/MYLANTA) 200-200-20 MG/5ML suspension 30 mL (has no administration in time range)  sucralfate (CARAFATE) tablet 1 g (has no administration in time range)    ED Course/ Medical Decision Making/ A&P                           Medical Decision Making Patient given medications for likely reflux.  Feels much better at this time.  No concern for ACS or PE.  EKG without ischemic changes.  Chest x-ray without evidence of pneumothorax.  Patient to follow-up with his GI doctor           Final Clinical Impression(s) / ED Diagnoses Final diagnoses:  SOB (shortness of breath)    Rx / DC Orders ED Discharge Orders     None         Lorre Nick, MD 05/31/21 2059

## 2021-05-31 NOTE — ED Triage Notes (Addendum)
Was eating lunchh  ate a bite and felt  epigastric pain all the way to stomach had hx of GERD, esophagitis, no n/v/v/D

## 2021-11-28 ENCOUNTER — Other Ambulatory Visit (HOSPITAL_COMMUNITY): Payer: Self-pay

## 2021-11-28 MED ORDER — AMPHETAMINE-DEXTROAMPHET ER 15 MG PO CP24
15.0000 mg | ORAL_CAPSULE | Freq: Two times a day (BID) | ORAL | 0 refills | Status: DC
  Filled 2021-11-28: qty 60, 30d supply, fill #0

## 2021-11-29 ENCOUNTER — Other Ambulatory Visit (HOSPITAL_COMMUNITY): Payer: Self-pay

## 2021-12-22 ENCOUNTER — Other Ambulatory Visit (HOSPITAL_COMMUNITY): Payer: Self-pay

## 2021-12-25 ENCOUNTER — Other Ambulatory Visit (HOSPITAL_COMMUNITY): Payer: Self-pay

## 2021-12-26 ENCOUNTER — Other Ambulatory Visit (HOSPITAL_COMMUNITY): Payer: Self-pay

## 2021-12-26 MED ORDER — AMPHETAMINE-DEXTROAMPHET ER 15 MG PO CP24
15.0000 mg | ORAL_CAPSULE | Freq: Two times a day (BID) | ORAL | 0 refills | Status: DC
  Filled 2021-12-26 – 2021-12-29 (×2): qty 60, 30d supply, fill #0

## 2021-12-29 ENCOUNTER — Other Ambulatory Visit (HOSPITAL_COMMUNITY): Payer: Self-pay

## 2022-01-24 ENCOUNTER — Other Ambulatory Visit (HOSPITAL_COMMUNITY): Payer: Self-pay

## 2022-01-30 ENCOUNTER — Other Ambulatory Visit (HOSPITAL_COMMUNITY): Payer: Self-pay

## 2022-01-30 MED ORDER — AMPHETAMINE-DEXTROAMPHET ER 15 MG PO CP24
15.0000 mg | ORAL_CAPSULE | Freq: Two times a day (BID) | ORAL | 0 refills | Status: DC
  Filled 2022-01-30: qty 60, 30d supply, fill #0

## 2022-03-07 ENCOUNTER — Other Ambulatory Visit (HOSPITAL_COMMUNITY): Payer: Self-pay

## 2022-03-07 MED ORDER — AMPHETAMINE-DEXTROAMPHET ER 15 MG PO CP24
15.0000 mg | ORAL_CAPSULE | Freq: Two times a day (BID) | ORAL | 0 refills | Status: DC
  Filled 2022-03-07: qty 60, 30d supply, fill #0

## 2022-03-07 MED ORDER — AMPHETAMINE-DEXTROAMPHET ER 15 MG PO CP24
15.0000 mg | ORAL_CAPSULE | Freq: Two times a day (BID) | ORAL | 0 refills | Status: AC
  Filled 2022-04-11: qty 60, 30d supply, fill #0

## 2022-03-07 MED ORDER — AMPHETAMINE-DEXTROAMPHET ER 15 MG PO CP24
15.0000 mg | ORAL_CAPSULE | Freq: Two times a day (BID) | ORAL | 0 refills | Status: DC
  Filled 2022-03-07 – 2022-05-09 (×3): qty 60, 30d supply, fill #0

## 2022-04-11 ENCOUNTER — Other Ambulatory Visit (HOSPITAL_COMMUNITY): Payer: Self-pay

## 2022-05-09 ENCOUNTER — Other Ambulatory Visit (HOSPITAL_COMMUNITY): Payer: Self-pay

## 2022-05-09 ENCOUNTER — Other Ambulatory Visit: Payer: Self-pay

## 2022-06-07 ENCOUNTER — Other Ambulatory Visit (HOSPITAL_COMMUNITY): Payer: Self-pay

## 2022-06-07 MED ORDER — AMPHETAMINE-DEXTROAMPHET ER 15 MG PO CP24
15.0000 mg | ORAL_CAPSULE | Freq: Two times a day (BID) | ORAL | 0 refills | Status: DC
  Filled 2022-07-09: qty 60, 30d supply, fill #0

## 2022-06-07 MED ORDER — AMPHETAMINE-DEXTROAMPHET ER 15 MG PO CP24
15.0000 mg | ORAL_CAPSULE | Freq: Two times a day (BID) | ORAL | 0 refills | Status: DC
  Filled 2022-08-14: qty 60, 30d supply, fill #0

## 2022-06-07 MED ORDER — ALPRAZOLAM 0.5 MG PO TABS
0.2500 mg | ORAL_TABLET | Freq: Every day | ORAL | 1 refills | Status: DC | PRN
  Filled 2022-06-07: qty 30, 30d supply, fill #0
  Filled 2022-11-26: qty 30, 30d supply, fill #1

## 2022-06-07 MED ORDER — AMPHETAMINE-DEXTROAMPHET ER 15 MG PO CP24
15.0000 mg | ORAL_CAPSULE | Freq: Two times a day (BID) | ORAL | 0 refills | Status: DC
  Filled 2022-06-10 – 2022-06-14 (×2): qty 60, 30d supply, fill #0

## 2022-06-11 ENCOUNTER — Other Ambulatory Visit (HOSPITAL_COMMUNITY): Payer: Self-pay

## 2022-06-14 ENCOUNTER — Other Ambulatory Visit (HOSPITAL_COMMUNITY): Payer: Self-pay

## 2022-07-09 ENCOUNTER — Other Ambulatory Visit (HOSPITAL_COMMUNITY): Payer: Self-pay

## 2022-08-14 ENCOUNTER — Other Ambulatory Visit: Payer: Self-pay

## 2022-08-14 ENCOUNTER — Other Ambulatory Visit (HOSPITAL_COMMUNITY): Payer: Self-pay

## 2022-09-06 ENCOUNTER — Other Ambulatory Visit (HOSPITAL_COMMUNITY): Payer: Self-pay

## 2022-09-06 MED ORDER — AMPHETAMINE-DEXTROAMPHET ER 15 MG PO CP24
15.0000 mg | ORAL_CAPSULE | Freq: Two times a day (BID) | ORAL | 0 refills | Status: DC
  Filled 2022-09-06 – 2022-10-24 (×3): qty 60, 30d supply, fill #0

## 2022-09-06 MED ORDER — AMPHETAMINE-DEXTROAMPHET ER 15 MG PO CP24
15.0000 mg | ORAL_CAPSULE | Freq: Two times a day (BID) | ORAL | 0 refills | Status: DC
  Filled 2022-11-26: qty 60, 30d supply, fill #0

## 2022-09-06 MED ORDER — AMPHETAMINE-DEXTROAMPHET ER 15 MG PO CP24
15.0000 mg | ORAL_CAPSULE | Freq: Two times a day (BID) | ORAL | 0 refills | Status: DC
  Filled 2022-09-06 – 2022-09-11 (×2): qty 60, 30d supply, fill #0

## 2022-09-10 ENCOUNTER — Other Ambulatory Visit (HOSPITAL_COMMUNITY): Payer: Self-pay

## 2022-09-11 ENCOUNTER — Other Ambulatory Visit (HOSPITAL_COMMUNITY): Payer: Self-pay

## 2022-10-24 ENCOUNTER — Other Ambulatory Visit (HOSPITAL_COMMUNITY): Payer: Self-pay

## 2022-11-26 ENCOUNTER — Other Ambulatory Visit: Payer: Self-pay

## 2022-11-26 ENCOUNTER — Other Ambulatory Visit (HOSPITAL_COMMUNITY): Payer: Self-pay

## 2022-12-27 ENCOUNTER — Other Ambulatory Visit (HOSPITAL_COMMUNITY): Payer: Self-pay

## 2022-12-30 ENCOUNTER — Other Ambulatory Visit (HOSPITAL_COMMUNITY): Payer: Self-pay

## 2022-12-31 ENCOUNTER — Other Ambulatory Visit (HOSPITAL_COMMUNITY): Payer: Self-pay

## 2022-12-31 MED ORDER — AMPHETAMINE-DEXTROAMPHET ER 15 MG PO CP24
15.0000 mg | ORAL_CAPSULE | Freq: Two times a day (BID) | ORAL | 0 refills | Status: DC
  Filled 2022-12-31: qty 60, 30d supply, fill #0

## 2022-12-31 MED ORDER — AMPHETAMINE-DEXTROAMPHET ER 15 MG PO CP24
ORAL_CAPSULE | ORAL | 0 refills | Status: DC
Start: 2023-01-28 — End: 2023-12-09
  Filled 2023-02-06: qty 60, 30d supply, fill #0

## 2022-12-31 MED ORDER — AMPHETAMINE-DEXTROAMPHET ER 15 MG PO CP24: ORAL_CAPSULE | ORAL | 0 refills | Status: DC

## 2023-01-01 ENCOUNTER — Other Ambulatory Visit (HOSPITAL_COMMUNITY): Payer: Self-pay

## 2023-01-01 ENCOUNTER — Other Ambulatory Visit: Payer: Self-pay

## 2023-02-06 ENCOUNTER — Other Ambulatory Visit (HOSPITAL_COMMUNITY): Payer: Self-pay

## 2023-03-07 ENCOUNTER — Other Ambulatory Visit (HOSPITAL_COMMUNITY): Payer: Self-pay

## 2023-03-07 ENCOUNTER — Other Ambulatory Visit: Payer: Self-pay

## 2023-03-07 MED ORDER — AMPHETAMINE-DEXTROAMPHET ER 15 MG PO CP24
15.0000 mg | ORAL_CAPSULE | Freq: Two times a day (BID) | ORAL | 0 refills | Status: DC
Start: 2023-04-05 — End: 2023-12-09
  Filled 2023-04-11: qty 60, 30d supply, fill #0

## 2023-03-07 MED ORDER — AMPHETAMINE-DEXTROAMPHET ER 15 MG PO CP24
15.0000 mg | ORAL_CAPSULE | Freq: Two times a day (BID) | ORAL | 0 refills | Status: DC
Start: 2023-03-07 — End: 2023-12-09
  Filled 2023-03-07: qty 60, 30d supply, fill #0

## 2023-03-07 MED ORDER — AMPHETAMINE-DEXTROAMPHET ER 15 MG PO CP24
15.0000 mg | ORAL_CAPSULE | Freq: Two times a day (BID) | ORAL | 0 refills | Status: DC
Start: 2023-05-06 — End: 2023-07-07
  Filled 2023-05-13: qty 60, 30d supply, fill #0

## 2023-04-11 ENCOUNTER — Other Ambulatory Visit (HOSPITAL_COMMUNITY): Payer: Self-pay

## 2023-05-13 ENCOUNTER — Other Ambulatory Visit: Payer: Self-pay

## 2023-05-13 ENCOUNTER — Other Ambulatory Visit (HOSPITAL_COMMUNITY): Payer: Self-pay

## 2023-06-26 IMAGING — DX DG CHEST 2V
2 series · 2 of 2 positions shown · non-contrast
Comparison: None.

CLINICAL DATA: Epigastric pain, chest pain

EXAM:
CHEST - 2 VIEW

[chest pa]
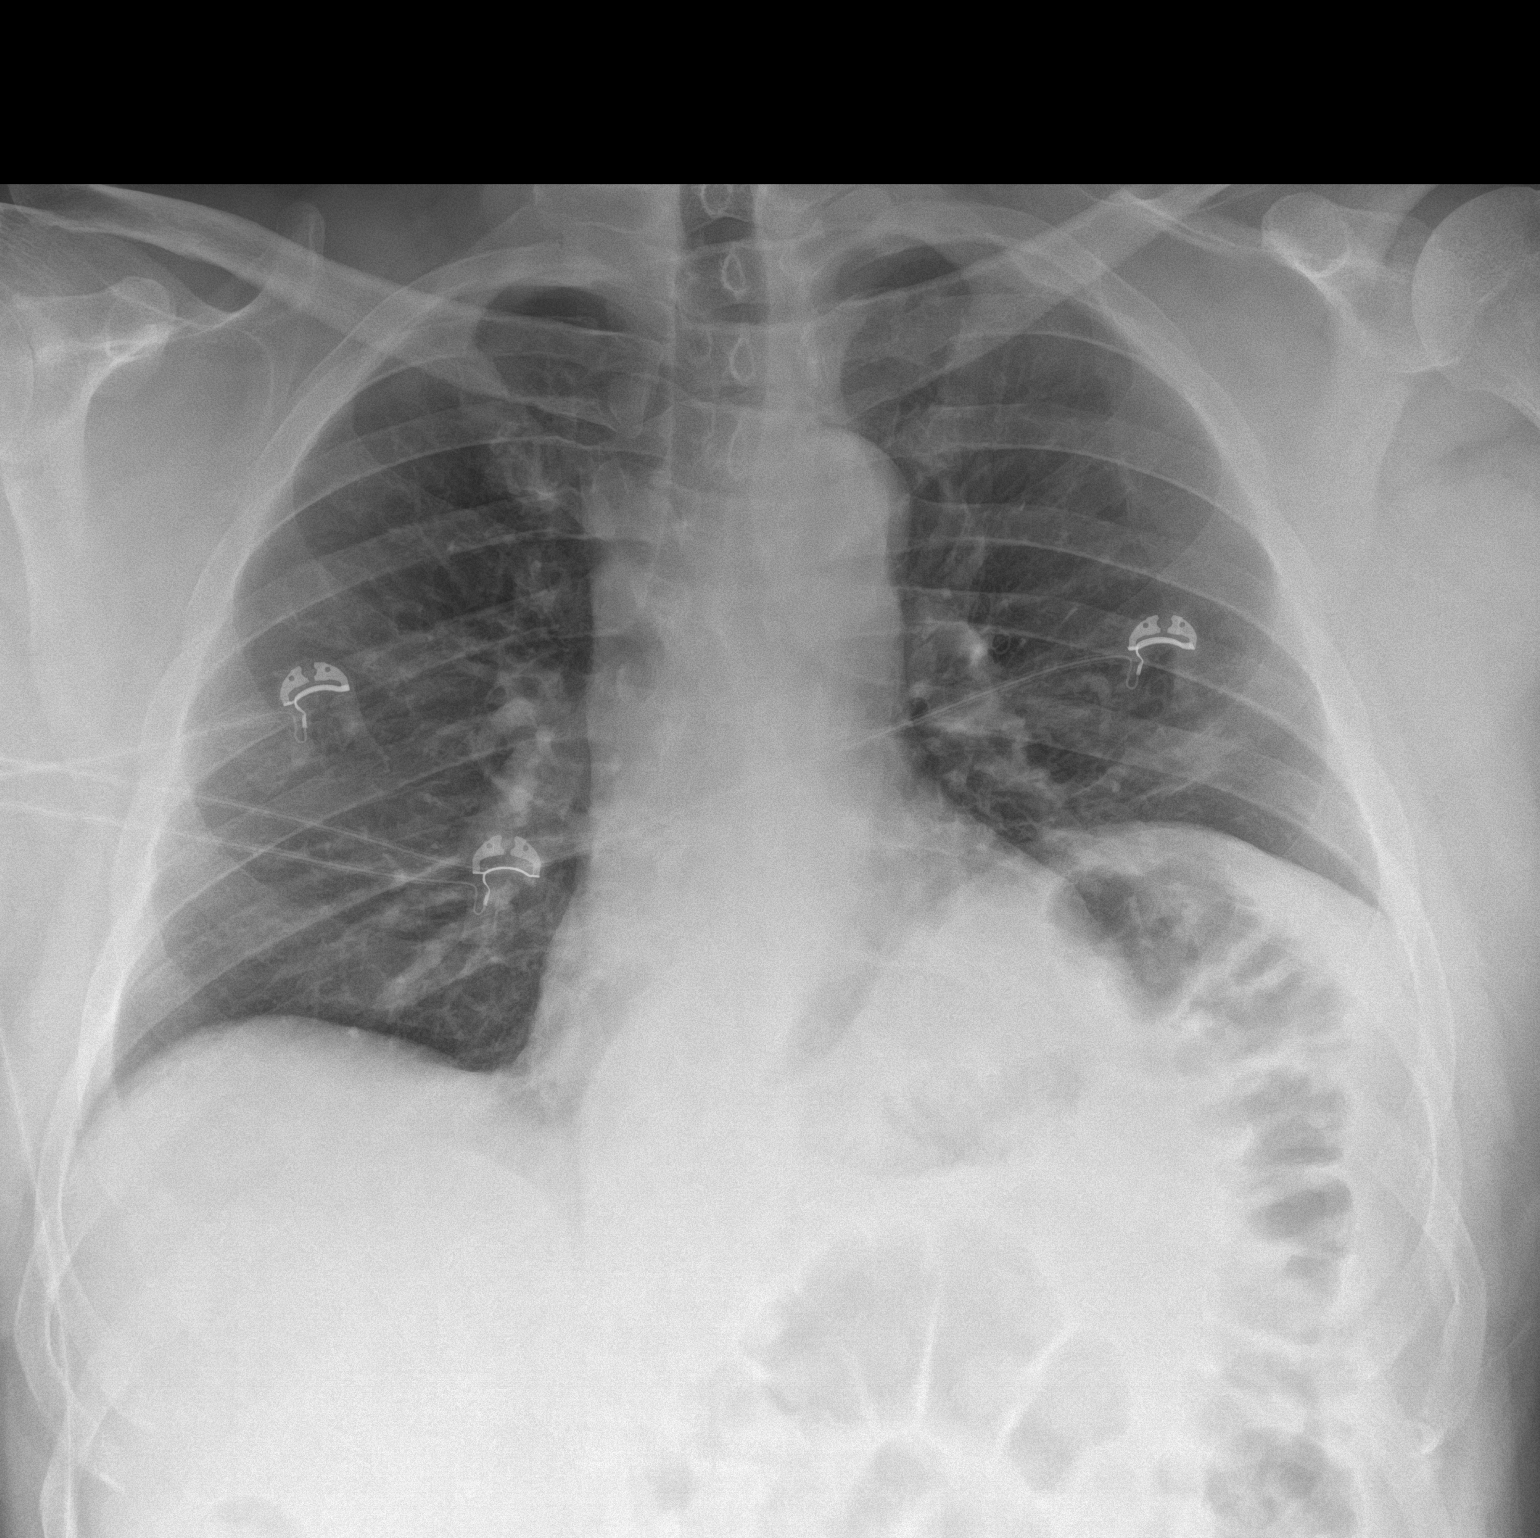

[chest lat]
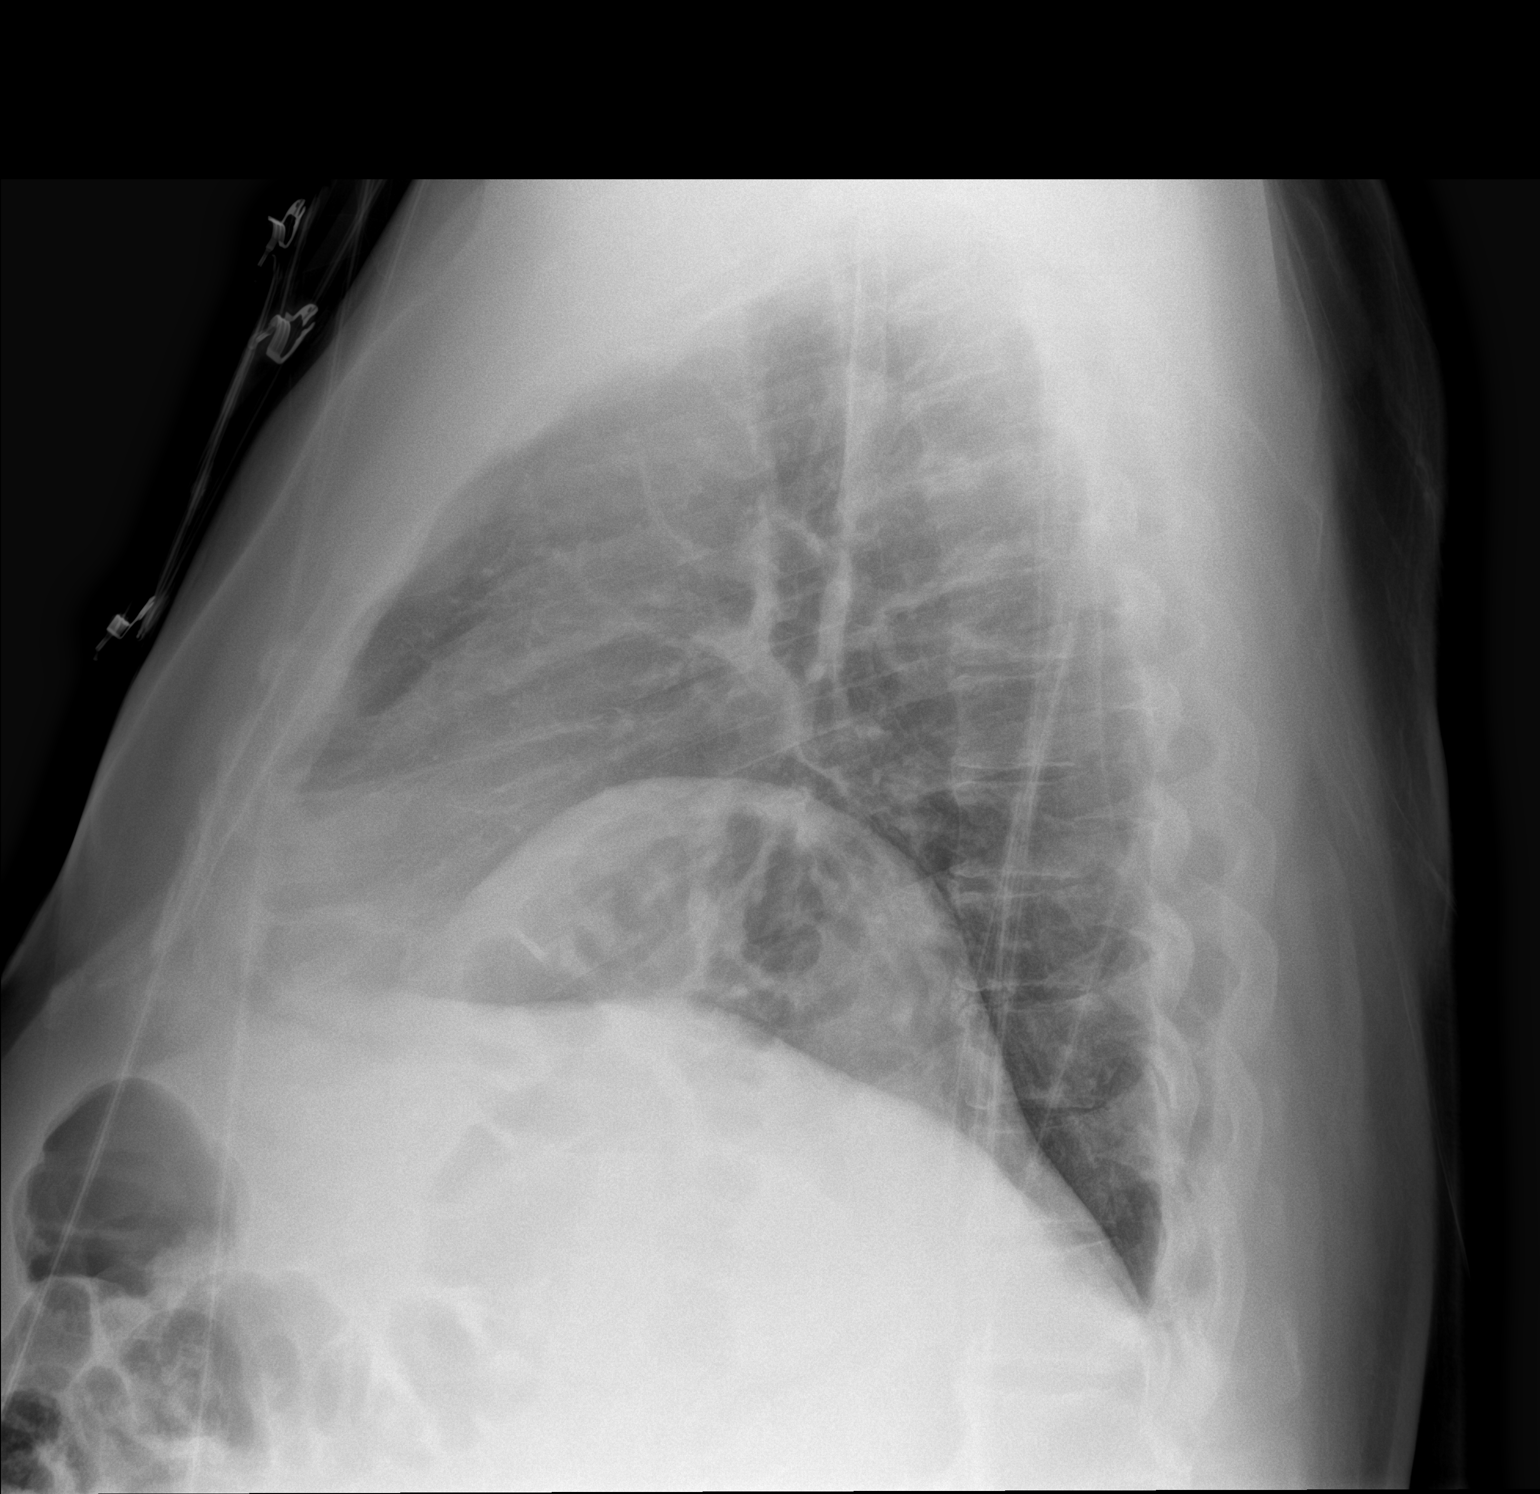

[2 of 2 positions shown; findings below may reference images not displayed]

FINDINGS: Cardiac size is within normal limits. There are no signs of
pulmonary edema. There is marked elevation of left hemidiaphragm.
Increased markings are seen in the left parahilar region adjacent to
the elevated left hemidiaphragm. Rest of the lung fields are clear.
There is no pleural effusion or pneumothorax.
IMPRESSION: Elevation of left hemidiaphragm may be due to eventration or
paralysis. Crowding of markings adjacent to the dome of the left
hemidiaphragm may suggest crowding of normal bronchovascular
structures or subsegmental atelectasis.

## 2023-07-01 ENCOUNTER — Other Ambulatory Visit (HOSPITAL_COMMUNITY): Payer: Self-pay

## 2023-07-04 ENCOUNTER — Other Ambulatory Visit (HOSPITAL_COMMUNITY): Payer: Self-pay

## 2023-07-07 ENCOUNTER — Other Ambulatory Visit (HOSPITAL_COMMUNITY): Payer: Self-pay

## 2023-07-07 MED ORDER — AMPHETAMINE-DEXTROAMPHET ER 15 MG PO CP24
15.0000 mg | ORAL_CAPSULE | Freq: Two times a day (BID) | ORAL | 0 refills | Status: DC
Start: 2023-08-04 — End: 2023-09-19
  Filled 2023-08-14: qty 60, 30d supply, fill #0

## 2023-07-07 MED ORDER — AMPHETAMINE-DEXTROAMPHET ER 15 MG PO CP24
15.0000 mg | ORAL_CAPSULE | Freq: Two times a day (BID) | ORAL | 0 refills | Status: DC
Start: 2023-07-07 — End: 2023-12-09
  Filled 2023-07-07: qty 60, 30d supply, fill #0

## 2023-07-08 ENCOUNTER — Other Ambulatory Visit (HOSPITAL_COMMUNITY): Payer: Self-pay

## 2023-08-14 ENCOUNTER — Other Ambulatory Visit (HOSPITAL_COMMUNITY): Payer: Self-pay

## 2023-09-19 ENCOUNTER — Other Ambulatory Visit (HOSPITAL_COMMUNITY): Payer: Self-pay

## 2023-09-19 MED ORDER — AMPHETAMINE-DEXTROAMPHET ER 15 MG PO CP24
15.0000 mg | ORAL_CAPSULE | Freq: Two times a day (BID) | ORAL | 0 refills | Status: DC
  Filled 2023-09-19: qty 60, 30d supply, fill #0

## 2023-09-19 MED ORDER — AMPHETAMINE-DEXTROAMPHET ER 15 MG PO CP24
15.0000 mg | ORAL_CAPSULE | Freq: Two times a day (BID) | ORAL | 0 refills | Status: DC
Start: 2023-11-18 — End: 2023-12-09
  Filled 2023-11-25: qty 60, 30d supply, fill #0

## 2023-09-19 MED ORDER — AMPHETAMINE-DEXTROAMPHET ER 15 MG PO CP24
15.0000 mg | ORAL_CAPSULE | Freq: Two times a day (BID) | ORAL | 0 refills | Status: DC
Start: 2023-10-19 — End: 2023-12-09
  Filled 2023-10-23: qty 60, 30d supply, fill #0

## 2023-09-19 MED ORDER — ALPRAZOLAM 0.5 MG PO TABS
0.2500 mg | ORAL_TABLET | Freq: Every day | ORAL | 1 refills | Status: AC | PRN
Start: 2023-09-19 — End: ?
  Filled 2023-09-19 (×2): qty 30, 30d supply, fill #0
  Filled 2024-03-02: qty 30, 30d supply, fill #1

## 2023-09-24 ENCOUNTER — Other Ambulatory Visit: Payer: Self-pay

## 2023-10-23 ENCOUNTER — Other Ambulatory Visit (HOSPITAL_COMMUNITY): Payer: Self-pay

## 2023-11-26 ENCOUNTER — Other Ambulatory Visit (HOSPITAL_COMMUNITY): Payer: Self-pay

## 2023-12-01 ENCOUNTER — Emergency Department (HOSPITAL_BASED_OUTPATIENT_CLINIC_OR_DEPARTMENT_OTHER)
Admission: EM | Admit: 2023-12-01 | Discharge: 2023-12-01 | Disposition: A | Discharge status: Home / Self Care | Attending: Emergency Medicine | Admitting: Emergency Medicine

## 2023-12-01 ENCOUNTER — Other Ambulatory Visit: Payer: Self-pay

## 2023-12-01 ENCOUNTER — Emergency Department (HOSPITAL_BASED_OUTPATIENT_CLINIC_OR_DEPARTMENT_OTHER)

## 2023-12-01 ENCOUNTER — Encounter (HOSPITAL_BASED_OUTPATIENT_CLINIC_OR_DEPARTMENT_OTHER): Payer: Self-pay

## 2023-12-01 ENCOUNTER — Emergency Department (HOSPITAL_COMMUNITY)

## 2023-12-01 DIAGNOSIS — R202 Paresthesia of skin: Secondary | ICD-10-CM | POA: Insufficient documentation

## 2023-12-01 DIAGNOSIS — R200 Anesthesia of skin: Secondary | ICD-10-CM

## 2023-12-01 DIAGNOSIS — I10 Essential (primary) hypertension: Secondary | ICD-10-CM | POA: Insufficient documentation

## 2023-12-01 DIAGNOSIS — Z7982 Long term (current) use of aspirin: Secondary | ICD-10-CM | POA: Insufficient documentation

## 2023-12-01 LAB — CBC WITH DIFFERENTIAL/PLATELET
Abs Immature Granulocytes: 0.01 K/uL (ref 0.00–0.07)
Basophils Absolute: 0 K/uL (ref 0.0–0.1)
Basophils Relative: 1 %
Eosinophils Absolute: 0.4 K/uL (ref 0.0–0.5)
Eosinophils Relative: 6 %
HCT: 46.2 % (ref 39.0–52.0)
Hemoglobin: 15.1 g/dL (ref 13.0–17.0)
Immature Granulocytes: 0 %
Lymphocytes Relative: 30 %
Lymphs Abs: 2 K/uL (ref 0.7–4.0)
MCH: 29.7 pg (ref 26.0–34.0)
MCHC: 32.7 g/dL (ref 30.0–36.0)
MCV: 90.8 fL (ref 80.0–100.0)
Monocytes Absolute: 0.6 K/uL (ref 0.1–1.0)
Monocytes Relative: 9 %
Neutro Abs: 3.7 K/uL (ref 1.7–7.7)
Neutrophils Relative %: 54 %
Platelets: 207 K/uL (ref 150–400)
RBC: 5.09 MIL/uL (ref 4.22–5.81)
RDW: 12.5 % (ref 11.5–15.5)
WBC: 6.8 K/uL (ref 4.0–10.5)
nRBC: 0 % (ref 0.0–0.2)

## 2023-12-01 LAB — HEPATIC FUNCTION PANEL
ALT: 22 U/L (ref 0–44)
AST: 23 U/L (ref 15–41)
Albumin: 4.4 g/dL (ref 3.5–5.0)
Alkaline Phosphatase: 75 U/L (ref 38–126)
Bilirubin, Direct: 0.2 mg/dL (ref 0.0–0.2)
Indirect Bilirubin: 0.3 mg/dL (ref 0.3–0.9)
Total Bilirubin: 0.5 mg/dL (ref 0.0–1.2)
Total Protein: 7.1 g/dL (ref 6.5–8.1)

## 2023-12-01 LAB — BASIC METABOLIC PANEL WITH GFR
Anion gap: 11 (ref 5–15)
BUN: 15 mg/dL (ref 6–20)
CO2: 24 mmol/L (ref 22–32)
Calcium: 10.1 mg/dL (ref 8.9–10.3)
Chloride: 104 mmol/L (ref 98–111)
Creatinine, Ser: 1.05 mg/dL (ref 0.61–1.24)
GFR, Estimated: >60 mL/min (ref >60)
Glucose, Bld: 145 mg/dL — ABNORMAL HIGH (ref 70–99)
Potassium: 4.3 mmol/L (ref 3.5–5.1)
Sodium: 139 mmol/L (ref 135–145)

## 2023-12-01 LAB — CBG MONITORING, ED: Glucose-Capillary: 152 mg/dL — ABNORMAL HIGH (ref 70–99)

## 2023-12-01 MED ORDER — GADOBUTROL 1 MMOL/ML IV SOLN
10.0000 mL | Freq: Once | INTRAVENOUS | Status: AC | PRN
  Administered 2023-12-01: 10 mL via INTRAVENOUS

## 2023-12-01 MED ORDER — PREDNISONE 20 MG PO TABS
60.0000 mg | ORAL_TABLET | ORAL | Status: AC
  Administered 2023-12-01: 60 mg via ORAL
  Filled 2023-12-01: qty 3

## 2023-12-01 MED ORDER — IOHEXOL 350 MG/ML SOLN
75.0000 mL | Freq: Once | INTRAVENOUS | Status: AC | PRN
  Administered 2023-12-01: 75 mL via INTRAVENOUS

## 2023-12-01 MED ORDER — PREDNISONE 20 MG PO TABS: 40.0000 mg | ORAL_TABLET | Freq: Every day | ORAL | 0 refills | Status: AC

## 2023-12-01 NOTE — ED Provider Notes (Signed)
 [place] EMERGENCY DEPARTMENT AT [facility] Provider Note   CSN: [id number] Arrival date & time: 12/01/23  9175     Patient presents with: Numbness   Brett Hester is a 63 y.o. male.   Patient presents with numbness to left face, left shoulder and left mid flank.  Shoulder numbness for approximately 2 weeks fairly constant.  Woke up with left facial numbness.  No weakness no difficulty speech no loss of vision or double vision.  No known vascular disease or history of stroke.  Significant other in the room.  The history is provided by the patient.       Prior to Admission medications   Medication Sig Start Date End Date Taking? Authorizing Provider  ALPRAZolam  (XANAX ) 0.5 MG tablet Take 0.5 mg by mouth at bedtime as needed for anxiety.    [provider]  ALPRAZolam  (XANAX ) 0.5 MG tablet Take 1/2-1 tablet (0.25-0.5 mg total) by mouth daily as needed. 06/07/22     ALPRAZolam  (XANAX ) 0.5 MG tablet Take 1/2-1 tablet (0.25-0.5 mg total) by mouth daily as needed for performance anxiety. 09/19/23     Amphet-Dextroamphet 3-Bead ER (MYDAYIS) 37.5 MG CP24 Take by mouth.    [provider]  amphetamine -dextroamphetamine  (ADDERALL XR) 15 MG 24 hr capsule Take 1 capsule by mouth 2 (two) times daily. (may be filled 30 days after date prescribed) 04/07/22     amphetamine -dextroamphetamine  (ADDERALL XR) 15 MG 24 hr capsule Take 1 capsule by mouth 2 (two) times daily. 03/07/22     amphetamine -dextroamphetamine  (ADDERALL XR) 15 MG 24 hr capsule Take 1 capsule by mouth 2 (two) times daily. 06/07/22     amphetamine -dextroamphetamine  (ADDERALL XR) 15 MG 24 hr capsule Take 1 capsule by mouth 2 (two) times daily. May be filled 30 days after date prescribed (07-08-22) 07/08/22     amphetamine -dextroamphetamine  (ADDERALL XR) 15 MG 24 hr capsule Take 1 capsule by mouth 2 (two) times daily. 09/06/22     amphetamine -dextroamphetamine  (ADDERALL XR) 15 MG 24 hr capsule Take 1 capsule by  mouth 2 (two) times daily. 09/06/22     amphetamine -dextroamphetamine  (ADDERALL XR) 15 MG 24 hr capsule Take 1 capsule by mouth 2 (two) times daily. 12/28/22     amphetamine -dextroamphetamine  (ADDERALL XR) 15 MG 24 hr capsule 1 capsule by mouth twice a day; may be filled 30 days after date prescribed 01/28/23     amphetamine -dextroamphetamine  (ADDERALL XR) 15 MG 24 hr capsule 1 capsule by mouth twice a day; may be filled 60 days after date prescribed 02/27/23     amphetamine -dextroamphetamine  (ADDERALL XR) 15 MG 24 hr capsule Take 1 capsule by mouth 2 (two) times daily. 04/05/23     amphetamine -dextroamphetamine  (ADDERALL XR) 15 MG 24 hr capsule Take 1 capsule by mouth 2 (two) times daily. 03/07/23     amphetamine -dextroamphetamine  (ADDERALL XR) 15 MG 24 hr capsule Take 1 capsule by mouth 2 (two) times daily. 07/07/23     amphetamine -dextroamphetamine  (ADDERALL XR) 15 MG 24 hr capsule Take 1 capsule by mouth 2 (two) times daily. 11/18/23     amphetamine -dextroamphetamine  (ADDERALL XR) 15 MG 24 hr capsule Take 1 capsule by mouth 2 (two) times daily. 10/19/23     amphetamine -dextroamphetamine  (ADDERALL XR) 15 MG 24 hr capsule Take 1 capsule by mouth 2 (two) times daily. 09/19/23     aspirin  EC 81 MG EC tablet Take 1 tablet (81 mg total) by mouth daily. 06/04/16   Tobie Yetta HERO, MD  atorvastatin  (LIPITOR) 40 MG tablet  Take 1 tablet (40 mg total) by mouth daily at 6 PM. 06/03/16   Tobie Yetta HERO, MD  RABEprazole  (ACIPHEX ) 20 MG tablet Take 1 tablet (20 mg total) by mouth daily. 05/31/21   Dasie Faden, MD  tamoxifen  (NOLVADEX ) 10 MG tablet Take 1 tablet (10 mg total) by mouth daily. 09/11/16   Kassie Mallick, MD    Allergies: Patient has no known allergies.    Review of Systems  Constitutional:  Negative for chills and fever.  HENT:  Negative for congestion.   Eyes:  Negative for visual disturbance.  Respiratory:  Negative for shortness of breath.   Cardiovascular:  Negative for chest pain.   Gastrointestinal:  Negative for abdominal pain and vomiting.  Genitourinary:  Negative for dysuria and flank pain.  Musculoskeletal:  Negative for back pain, neck pain and neck stiffness.  Skin:  Negative for rash.  Neurological:  Positive for numbness. Negative for light-headedness and headaches.    Updated Vital Signs BP 136/88   Pulse (!) 59   Temp 98.1 F (36.7 C) (Oral)   Resp 13   Ht 6' (1.829 m)   Wt 123.4 kg   SpO2 96%   BMI 36.89 kg/m   Physical Exam Vitals and nursing note reviewed.  Constitutional:      General: He is not in acute distress.    Appearance: He is well-developed.  HENT:     Head: Normocephalic and atraumatic.     Mouth/Throat:     Mouth: Mucous membranes are moist.  Eyes:     General:        Right eye: No discharge.        Left eye: No discharge.     Conjunctiva/sclera: Conjunctivae normal.  Neck:     Trachea: No tracheal deviation.  Cardiovascular:     Rate and Rhythm: Normal rate and regular rhythm.     Heart sounds: No murmur heard. Pulmonary:     Effort: Pulmonary effort is normal.     Breath sounds: Normal breath sounds.  Abdominal:     General: There is no distension.     Palpations: Abdomen is soft.     Tenderness: There is no abdominal tenderness. There is no guarding.  Musculoskeletal:        General: No swelling.     Cervical back: Normal range of motion and neck supple. No rigidity.  Skin:    General: Skin is warm.     Capillary Refill: Capillary refill takes less than 2 seconds.     Findings: No rash.  Neurological:     General: No focal deficit present.     Mental Status: He is alert.     GCS: GCS eye subscore is 4. GCS verbal subscore is 5. GCS motor subscore is 6.     Cranial Nerves: No cranial nerve deficit, dysarthria or facial asymmetry.     Sensory: Sensory deficit present.     Motor: No weakness.     Comments: Patient has normal strength flexion extension of all extremities equal bilateral.  Patient has  subjective decrease sensation left anterior shoulder, left flank without rash left bicep region on the left compared to the right.  Psychiatric:        Mood and Affect: Mood normal.     (all labs ordered are listed, but only abnormal results are displayed) Labs Reviewed  BASIC METABOLIC PANEL WITH GFR - Abnormal; Notable for the following components:      Result Value  Glucose, Bld 145 (*)    All other components within normal limits  CBG MONITORING, ED - Abnormal; Notable for the following components:   Glucose-Capillary 152 (*)    All other components within normal limits  HEPATIC FUNCTION PANEL  CBC WITH DIFFERENTIAL/PLATELET    EKG: None  Radiology: CT ANGIO HEAD NECK W WO CM Result Date: 12/01/2023 EXAM: CT HEAD WITHOUT CTA HEAD AND NECK WITH AND WITHOUT 12/01/2023 10:13:39 AM TECHNIQUE: CTA of the head and neck was performed with and without the administration of intravenous contrast. Noncontrast CT of the head with reconstructed 2-D images are also provided for review. Multiplanar 2D and/or 3D reformatted images are provided for review. Automated exposure control, iterative reconstruction, and/or weight based adjustment of the mA/kV was utilized to reduce the radiation dose to as low as reasonably achievable. COMPARISON: CT head without contrast 06/02/2016. MR head without contrast 06/03/2016. CLINICAL HISTORY: Left face/ shoulder/ abd numbness. Left side facial numbness onset today, left upper extremity numbness x 2 weeks. FINDINGS: CT HEAD: BRAIN AND VENTRICLES: No acute intracranial hemorrhage. No mass effect or midline shift. No extra-axial fluid collection. Gray-white differentiation is maintained. No hydrocephalus. ORBITS: No acute abnormality. SINUSES: No acute abnormality. SOFT TISSUES AND SKULL: No acute abnormality. CTA NECK: AORTIC ARCH AND ARCH VESSELS: Atherosclerotic calcifications are present in the distal aortic arch. Great vessel origins are within normal limits. No  dissection or arterial injury. No significant stenosis of the brachiocephalic or subclavian arteries. CERVICAL CAROTID ARTERIES: Minimal calcifications are present in the left carotid bifurcation. No significant stenosis is present. No dissection or arterial injury. CERVICAL VERTEBRAL ARTERIES: The left vertebral artery is dominant. No significant stenosis is present in either vertebral artery in the neck. No dissection or arterial injury. VISUALIZED LUNGS AND MEDIASTINUM: Unremarkable. SOFT TISSUES: No acute abnormality. BONES: Levoconvex curvature is present in the upper thoracic spine. Vertebral body heights and alignment are maintained. No acute abnormality. CTA HEAD: ANTERIOR CIRCULATION: Atherosclerotic calcifications are present within the cavernous internal carotid arteries bilaterally. No significant stenosis is present through the ICA terminus. The left A1 segment is dominant. The anterior communicating artery is patent. No aneurysm. POSTERIOR CIRCULATION: The right posterior cerebral artery is of fetal type. No significant stenosis of the posterior cerebral arteries. No significant stenosis of the basilar artery. No significant stenosis of the vertebral arteries. No aneurysm. OTHER: No dural venous sinus thrombosis on this non-dedicated study. IMPRESSION: 1. No acute intracranial hemorrhage or other acute abnormality on CT head. 2. No large vessel occlusion, hemodynamically significant stenosis, or aneurysm in the head or neck on CTA. 3. Mild atherosclerotic changes as described above. Electronically signed by: Lonni Necessary MD 12/01/2023 10:22 AM EDT RP Workstation: [id number]     Procedures   Medications Ordered in the ED  iohexol  (OMNIPAQUE ) 350 MG/ML injection 75 mL (75 mLs Intravenous Contrast Given 12/01/23 1006)                                    Medical Decision Making Amount and/or Complexity of Data Reviewed Labs: ordered. Radiology: ordered.  Risk Prescription drug  management.   Patient presents with elevated blood pressure and intermittent left sided numbness.  Discussed broad differential diagnosis.  General blood work ordered and pending reviewed normal glucose electrolytes unremarkable, normal hemoglobin.  EKG reviewed sinus rhythm. CT angiogram head and neck ordered pending results.  Patient not in acute code stroke time window given  numbness intermittent for 2 weeks and no focal weakness or other concerns at this time.  Plan discussed with neurology pending results.  CT angiogram results no acute findings.  Neurology recommends transfer for MRIs.  Updated patient and his wife who prefer to drive by private vehicle.  Discussed with Dr. Laurice who accepts [facility].  MRI brain with and without, MRI cervical with and without contrast ordered.     Final diagnoses:  Numbness on left side  Primary hypertension    ED Discharge Orders     None          Tonia Chew, MD 12/01/23 1218

## 2023-12-01 NOTE — Discharge Instructions (Addendum)
 You will need close follow-up with your primary doctor and our neurology colleagues.  They may consider weaning your Adderall due to your elevated blood pressure.  Neurology office will contact you in the coming days for follow-up.

## 2023-12-01 NOTE — ED Provider Notes (Signed)
 Patient transferred to our facility for MRI.  MRI results were reviewed, and discussed with neurology.  No acute findings, patient in no distress, no facial asymmetry, no speech difficulty, patient will follow-up closely with neurology, referral placed, start steroids for possible radiculopathy.   Garrick Charleston, MD 12/01/23 [phone]

## 2023-12-01 NOTE — ED Triage Notes (Signed)
 In for eval of numbness to left shoulder x2 weeks. Woke up with numbness to left side of face today with slight slurred speech and mild dizziness. LKW yesterday at approx 0000. Denies headache or vision problems. Denies injury.

## 2023-12-01 NOTE — ED Notes (Signed)
 Attempted to call report without success, no answer

## 2023-12-01 NOTE — ED Notes (Signed)
 Patient's IV secured with kerlex and coban for transfer to [facility] ED by POV.

## 2023-12-01 NOTE — ED Notes (Signed)
 Patient to CT via w/c

## 2023-12-04 ENCOUNTER — Other Ambulatory Visit (HOSPITAL_COMMUNITY): Payer: Self-pay

## 2023-12-09 ENCOUNTER — Encounter: Payer: Self-pay | Admitting: Neurology

## 2023-12-09 ENCOUNTER — Ambulatory Visit: Admitting: Neurology

## 2023-12-09 VITALS — BP 162/94 | HR 73 | Resp 16 | Ht 72.0 in | Wt 273.0 lb

## 2023-12-09 DIAGNOSIS — Q283 Other malformations of cerebral vessels: Secondary | ICD-10-CM | POA: Diagnosis not present

## 2023-12-09 DIAGNOSIS — R202 Paresthesia of skin: Secondary | ICD-10-CM | POA: Insufficient documentation

## 2023-12-09 MED ORDER — LAMOTRIGINE 25 MG PO TABS: ORAL_TABLET | ORAL | 0 refills | Status: DC

## 2023-12-09 NOTE — Progress Notes (Signed)
 Chief Complaint  Patient presents with   New Patient (Initial Visit)    Rm14, alone,  internal referral for numbness on left side: occurred last Tuesday went to er       ASSESSMENT AND PLAN  Brett Brett is a 63 y.o. 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   MEDICAL HISTORY:  Brett Brett is a 63 year old male, seen in request by his primary care at [facility] Dr. Auston, Brett for evaluation of left-sided paresthesia, abnormal MRI of the brain, initial evaluation Brett Brett  History is obtained from the patient and review of electronic medical records. I personally reviewed pertinent available imaging films in PACS.   PMHx of  ADHD Anxiety HLD-had muscle weakness, did not feel good taking statin, stop in 2020 Obesity  He works as a Psychiatric nurse, lost follow-up with his primary care over the past few years, do have hypertension, blood pressure today 162/94, previously diagnosed with hyperlipidemia, but did not feel good taking statin, stopped in 2020  He carried a diagnosis of ADHD, described difficulty focusing, but sometimes also become hyperfocused, over the years, he has been treated with Adderall 15 mg twice a day, also has anxiety, taking Xanax   intermittently  He denies a history of seizure, no confusion episode  On Brett Brett, shortly after he woke up, he noticed numbness of left side involving left face, trunk, arm and leg, lasting for more than 12 hours, presented to the urgent care, had MRI of brain with without contrast, no acute intracranial abnormality, 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   PHYSICAL EXAM:   Vitals:   12/09/23 0904 12/09/23 0910  BP: (!) 141/88 (!) 162/94  Pulse: 73   Resp: 16   Weight: 273 lb (123.8 kg)   Height: 6' (1.829 m)      Body mass index is 37.03 kg/m.  PHYSICAL EXAMNIATION:  Gen: NAD, conversant, well nourised, well groomed                     Cardiovascular: Regular rate rhythm, no peripheral edema, warm, nontender. Eyes: Conjunctivae clear without exudates or hemorrhage Neck: Supple, no carotid bruits. Pulmonary: Clear to auscultation bilaterally   NEUROLOGICAL EXAM:  MENTAL STATUS: Speech/cognition: Obese, Awake, alert, oriented to history taking and casual conversation CRANIAL NERVES: CN II: Visual fields are full to confrontation. Pupils are round equal and briskly reactive to light. CN III, IV, VI: extraocular movement are normal. No ptosis. CN V: Facial sensation  is intact to light touch CN VII: Face is symmetric with normal eye closure  CN VIII: Hearing is normal to causal conversation. CN IX, X: Phonation is normal. CN XI: Head turning and shoulder shrug are intact  MOTOR: There is no pronator drift of out-stretched arms. Muscle bulk and tone are normal. Muscle strength is normal.  REFLEXES: Reflexes are 2+ and symmetric at the biceps, triceps, knees, and ankles. Plantar responses are flexor.  SENSORY: Intact to light touch,  pinprick and vibratory sensation are intact in fingers and toes.  COORDINATION: There is no trunk or limb dysmetria noted.  GAIT/STANCE: Posture is normal. Gait is steady   REVIEW OF SYSTEMS:  Full 14 system review of systems performed and notable only for as above All other review of systems were negative.   ALLERGIES: Allergies  Allergen Reactions   Escitalopram Oxalate     Other Reaction(s): flattened him too much   Lisdexamfetamine     Other Reaction(s): drowsy   Omeprazole     Other Reaction(s): GI Issues    HOME MEDICATIONS: Current Outpatient Medications  Medication Sig Dispense Refill   ALPRAZolam  (XANAX ) 0.5 MG tablet Take 1/2-1 tablet (0.25-0.5 mg total) by mouth daily as needed for performance anxiety. 30 tablet 1   amphetamine -dextroamphetamine  (ADDERALL XR) 15 MG 24 hr capsule Take 1 capsule by mouth 2 (two) times daily. (may be filled 30 days after date prescribed) 60 capsule 0   aspirin  EC 81 MG EC tablet Take 1 tablet (81 mg total) by mouth daily. 90 tablet 0   predniSONE  (DELTASONE ) 20 MG tablet Take 2 tablets (40 mg total) by mouth daily with breakfast. For the next four days 8 tablet 0   No current facility-administered medications for this visit.    PAST MEDICAL HISTORY: Past Medical History:  Diagnosis Date   ADHD    Anxiety    Hypertension     PAST SURGICAL HISTORY: History reviewed. No pertinent surgical history.  FAMILY HISTORY: Family History  Problem Relation Age of Onset   Other Neg Hx        hypogonadism    SOCIAL HISTORY: Social History   Socioeconomic History   Marital status: Married    Spouse name: Brett Hester   Number of children: 3   Years of education: Not on file   Highest education level: Bachelor's degree (e.g., BA, AB, BS)  Occupational History   Not on file  Tobacco Use   Smoking status: Never    Passive exposure: Never   Smokeless tobacco: Never  Vaping Use   Vaping status: Never Used  Substance and Sexual  Activity   Alcohol use: Yes    Alcohol/week: 1.0 standard drink of alcohol    Types: 1 Standard drinks or equivalent per week    Comment: 2-3 per month   Drug use: No   Sexual activity: Yes  Other Topics Concern   Not on file  Social History Narrative   Not on file   Social Drivers of Health   Financial Resource Strain: Not on file  Food Insecurity: Not on file  Transportation Needs: Not on file  Physical Activity: Not on file  Stress: Not on file  Social Connections: Not on file  Intimate Partner Violence: Not on file      Modena Callander, M.D. Ph.D.  [facility] [address], [address] [place], [place] [address] Ph: [phone] Fax: [phone]  CC:  Garrick Charleston, MD [address] [place],  [place] [address]  Brett Opal, DO

## 2023-12-10 ENCOUNTER — Telehealth: Payer: Self-pay | Admitting: Neurology

## 2023-12-10 NOTE — Telephone Encounter (Signed)
 Pt is needing a refill of his lamoTRIgine  (LAMICTAL ) 25 MG tablet sent in to the [facility] on [address]. Pt needs this done as soon as possible.

## 2023-12-11 ENCOUNTER — Encounter: Payer: Self-pay | Admitting: Neurology

## 2023-12-11 ENCOUNTER — Ambulatory Visit: Admitting: Neurology

## 2023-12-11 ENCOUNTER — Other Ambulatory Visit (HOSPITAL_COMMUNITY): Payer: Self-pay

## 2023-12-11 DIAGNOSIS — R202 Paresthesia of skin: Secondary | ICD-10-CM

## 2023-12-11 DIAGNOSIS — Q283 Other malformations of cerebral vessels: Secondary | ICD-10-CM

## 2023-12-11 DIAGNOSIS — R2990 Unspecified symptoms and signs involving the nervous system: Secondary | ICD-10-CM | POA: Diagnosis not present

## 2023-12-11 MED ORDER — LAMOTRIGINE 25 MG PO TABS
ORAL_TABLET | ORAL | 0 refills | Status: DC
  Filled 2023-12-11: qty 84, 21d supply, fill #0

## 2023-12-11 MED ORDER — LAMOTRIGINE 25 MG PO TABS: ORAL_TABLET | ORAL | 0 refills | Status: DC

## 2023-12-11 NOTE — Addendum Note (Signed)
 Addended by: ONEITA NEVELYN BRAVO on: 12/11/2023 01:12 PM   Modules accepted: Orders

## 2023-12-20 NOTE — Procedures (Signed)
   HISTORY: 63 years old male, with transient left-sided paresthesia  TECHNIQUE:  This is a routine 16 channel EEG recording with one channel devoted to a limited EKG recording.  It was performed during wakefulness, drowsiness and asleep.  Hyperventilation and photic stimulation were performed as activating procedures.  There are minimum muscle and movement artifact noted.  Upon maximum arousal, posterior dominant waking rhythm consistent of rhythmic alpha range activity. Activities are symmetric over the bilateral posterior derivations and attenuated with eye opening.  Photic stimulation did not alter the tracing.  Hyperventilation produced mild/moderate buildup with higher amplitude and the slower activities noted.  During EEG recording, patient developed drowsiness and no deeper stage of sleep was achieved.  During EEG recording, there was no epileptiform discharge noted.  EKG demonstrate normal sinus rhythm.  CONCLUSION: This is a  normal awake EEG.  There is no electrodiagnostic evidence of epileptiform discharge.  Jamy Cleckler, M.D. Ph.D.  [facility] [address] [place], [place] [address] Phone: [phone] Fax:      [phone]

## 2023-12-25 ENCOUNTER — Other Ambulatory Visit (HOSPITAL_COMMUNITY): Payer: Self-pay

## 2023-12-25 ENCOUNTER — Ambulatory Visit: Payer: Self-pay | Admitting: Neurology

## 2023-12-26 ENCOUNTER — Telehealth: Payer: Self-pay

## 2023-12-26 NOTE — Telephone Encounter (Signed)
 Call to patient, has been on lamictal  for since 7/24, now at 50 mg twice daily. Rash appeared under arms for 1 week. He also started a new deodorant before the rash appeared. I advised to hold  this morning dose until I speak with Dr, Onita. Patient in agreement and believes the rash is from new deodorant and he states he is sensitive to new things.

## 2023-12-27 ENCOUNTER — Other Ambulatory Visit (HOSPITAL_COMMUNITY): Payer: Self-pay

## 2023-12-27 ENCOUNTER — Other Ambulatory Visit: Payer: Self-pay

## 2023-12-27 ENCOUNTER — Telehealth: Payer: Self-pay

## 2023-12-27 MED ORDER — AMPHETAMINE-DEXTROAMPHET ER 15 MG PO CP24
15.0000 mg | ORAL_CAPSULE | Freq: Two times a day (BID) | ORAL | 0 refills | Status: AC
  Filled 2024-01-27 – 2024-01-30 (×2): qty 60, 30d supply, fill #0

## 2023-12-27 MED ORDER — AMPHETAMINE-DEXTROAMPHET ER 15 MG PO CP24
15.0000 mg | ORAL_CAPSULE | Freq: Two times a day (BID) | ORAL | 0 refills | Status: DC
  Filled 2024-03-02: qty 60, 30d supply, fill #0

## 2023-12-27 MED ORDER — AMPHETAMINE-DEXTROAMPHET ER 15 MG PO CP24
15.0000 mg | ORAL_CAPSULE | Freq: Two times a day (BID) | ORAL | 0 refills | Status: AC
  Filled 2023-12-27 – 2023-12-31 (×2): qty 60, 30d supply, fill #0

## 2023-12-27 NOTE — Telephone Encounter (Signed)
 Call to patient to follow up on rash. No answer. Left message to call if needed. Rash was suspected to be from new deodorant not lamictal . Advised to call if rash has spread.

## 2023-12-30 ENCOUNTER — Encounter (HOSPITAL_COMMUNITY): Payer: Self-pay

## 2023-12-30 ENCOUNTER — Other Ambulatory Visit: Payer: Self-pay

## 2023-12-30 ENCOUNTER — Other Ambulatory Visit (HOSPITAL_COMMUNITY): Payer: Self-pay

## 2023-12-31 ENCOUNTER — Other Ambulatory Visit: Payer: Self-pay

## 2024-01-01 ENCOUNTER — Other Ambulatory Visit (HOSPITAL_COMMUNITY): Payer: Self-pay

## 2024-01-01 ENCOUNTER — Other Ambulatory Visit (HOSPITAL_BASED_OUTPATIENT_CLINIC_OR_DEPARTMENT_OTHER): Payer: Self-pay

## 2024-01-01 MED ORDER — LAMOTRIGINE 100 MG PO TABS
100.0000 mg | ORAL_TABLET | Freq: Two times a day (BID) | ORAL | 3 refills | Status: AC
  Filled 2024-01-01: qty 60, 30d supply, fill #0
  Filled 2024-01-27: qty 60, 30d supply, fill #1
  Filled 2024-03-02: qty 60, 30d supply, fill #2
  Filled 2024-04-01: qty 60, 30d supply, fill #3
  Filled 2024-05-03: qty 60, 30d supply, fill #4
  Filled 2024-06-08: qty 180, 90d supply, fill #5

## 2024-01-01 NOTE — Addendum Note (Signed)
 Addended by: JOSHUA IZETTA CROME on: 01/01/2024 01:21 PM   Modules accepted: Orders

## 2024-01-02 ENCOUNTER — Other Ambulatory Visit (HOSPITAL_COMMUNITY): Payer: Self-pay

## 2024-01-27 ENCOUNTER — Other Ambulatory Visit (HOSPITAL_COMMUNITY): Payer: Self-pay

## 2024-01-28 ENCOUNTER — Other Ambulatory Visit (HOSPITAL_COMMUNITY): Payer: Self-pay

## 2024-01-30 ENCOUNTER — Other Ambulatory Visit (HOSPITAL_COMMUNITY): Payer: Self-pay

## 2024-03-02 ENCOUNTER — Other Ambulatory Visit (HOSPITAL_COMMUNITY): Payer: Self-pay

## 2024-03-03 ENCOUNTER — Other Ambulatory Visit: Payer: Self-pay

## 2024-03-17 ENCOUNTER — Other Ambulatory Visit (HOSPITAL_COMMUNITY): Payer: Self-pay

## 2024-03-17 MED ORDER — AMPHETAMINE-DEXTROAMPHET ER 15 MG PO CP24
15.0000 mg | ORAL_CAPSULE | Freq: Two times a day (BID) | ORAL | 0 refills | Status: AC
  Filled 2024-06-08: qty 60, 30d supply, fill #0

## 2024-03-17 MED ORDER — AMPHETAMINE-DEXTROAMPHET ER 15 MG PO CP24
15.0000 mg | ORAL_CAPSULE | Freq: Two times a day (BID) | ORAL | 0 refills | Status: AC
  Filled 2024-04-01: qty 60, 30d supply, fill #0

## 2024-03-17 MED ORDER — AMPHETAMINE-DEXTROAMPHET ER 15 MG PO CP24
15.0000 mg | ORAL_CAPSULE | Freq: Two times a day (BID) | ORAL | 0 refills | Status: AC
  Filled 2024-05-03: qty 60, 30d supply, fill #0

## 2024-04-02 ENCOUNTER — Other Ambulatory Visit (HOSPITAL_COMMUNITY): Payer: Self-pay

## 2024-05-03 ENCOUNTER — Other Ambulatory Visit (HOSPITAL_COMMUNITY): Payer: Self-pay

## 2024-06-08 ENCOUNTER — Other Ambulatory Visit (HOSPITAL_COMMUNITY): Payer: Self-pay

## 2024-06-08 ENCOUNTER — Other Ambulatory Visit: Payer: Self-pay

## 2024-06-09 ENCOUNTER — Other Ambulatory Visit (HOSPITAL_COMMUNITY): Payer: Self-pay

## 2024-06-11 ENCOUNTER — Ambulatory Visit: Admitting: Adult Health

## 2024-06-16 ENCOUNTER — Encounter: Payer: Self-pay | Admitting: Adult Health

## 2024-06-16 ENCOUNTER — Ambulatory Visit: Admitting: Adult Health

## 2024-06-16 NOTE — Progress Notes (Unsigned)
 "  No chief complaint on file.     ASSESSMENT AND PLAN  Brett Hester is a 64 y.o. 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   MEDICAL HISTORY:   Update 06/16/2024 JM: Patient returns for 42-month follow-up.       Consult visit 12/09/2023 Dr. Onita: Brett Hester is a 64 year old male, seen in request by his primary care at [facility] Dr. Auston, [place] for evaluation of left-sided paresthesia, abnormal MRI of the brain, initial evaluation December 09, 2023  History is obtained from the patient and review of electronic medical records. I personally reviewed pertinent available imaging films in PACS.   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 the past few years, do have hypertension, blood pressure today 162/94, previously diagnosed with hyperlipidemia, but did not feel good taking statin, stopped in 2020  He carried a diagnosis of ADHD, described difficulty focusing, but sometimes also become hyperfocused, over the years, he has been treated with Adderall 15 mg twice a day, also has anxiety, taking Xanax  intermittently  He denies a history of seizure, no confusion episode  On December 01, 2023, shortly after he woke up, he noticed numbness of left side involving left face, trunk, arm and leg, lasting for more than 12 hours, presented to the urgent care, had MRI of  brain with without contrast, no acute intracranial abnormality, 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   PHYSICAL EXAM:   There were no vitals filed for this visit.    There is no height or weight on file to calculate BMI.  PHYSICAL EXAMNIATION:  Gen: NAD, conversant, well nourised, well groomed                     Cardiovascular: Regular rate rhythm, no peripheral edema, warm, nontender. Eyes: Conjunctivae clear without exudates or hemorrhage Neck: Supple, no carotid bruits. Pulmonary: Clear to auscultation bilaterally   NEUROLOGICAL EXAM:  MENTAL STATUS: Speech/cognition: Obese, Awake, alert, oriented to history taking and casual conversation CRANIAL NERVES: CN II: Visual fields are full to confrontation. Pupils are round equal and briskly reactive to light. CN III, IV, VI: extraocular movement are normal. No ptosis. CN V: Facial sensation is intact to light touch CN VII: Face is symmetric with normal eye closure  CN VIII: Hearing is normal to causal conversation. CN IX, X: Phonation is normal. CN XI: Head turning and shoulder shrug are intact  MOTOR: There is no pronator drift of out-stretched arms. Muscle bulk and tone are normal. Muscle strength is normal.  REFLEXES: Reflexes are 2+  and symmetric at the biceps, triceps, knees, and ankles. Plantar responses are flexor.  SENSORY: Intact to light touch, pinprick and vibratory sensation are intact in fingers and toes.  COORDINATION: There is no trunk or limb dysmetria noted.  GAIT/STANCE: Posture is normal. Gait is steady   REVIEW OF SYSTEMS:  Full 14 system review of systems performed and notable only for as above All other review of systems were  negative.   ALLERGIES: Allergies  Allergen Reactions   Escitalopram Oxalate     Other Reaction(s): flattened him too much   Lisdexamfetamine     Other Reaction(s): drowsy   Omeprazole     Other Reaction(s): GI Issues    HOME MEDICATIONS: Current Outpatient Medications  Medication Sig Dispense Refill   ALPRAZolam  (XANAX ) 0.5 MG tablet Take 1/2-1 tablet (0.25-0.5 mg total) by mouth daily as needed for performance anxiety. 30 tablet 1   amphetamine -dextroamphetamine  (ADDERALL XR) 15 MG 24 hr capsule Take 1 capsule by mouth 2 (two) times daily. (may be filled 30 days after date prescribed) 60 capsule 0   amphetamine -dextroamphetamine  (ADDERALL XR) 15 MG 24 hr capsule Take 1 capsule by mouth 2 (two) times daily. 60 capsule 0   amphetamine -dextroamphetamine  (ADDERALL XR) 15 MG 24 hr capsule Take 1 capsule by mouth 2 (two) times daily. 60 capsule 0   amphetamine -dextroamphetamine  (ADDERALL XR) 15 MG 24 hr capsule Take 1 capsule by mouth 2 (two) times daily. 60 capsule 0   amphetamine -dextroamphetamine  (ADDERALL XR) 15 MG 24 hr capsule Take 1 capsule by mouth 2 (two) times daily. 60 capsule 0   amphetamine -dextroamphetamine  (ADDERALL XR) 15 MG 24 hr capsule Take 1 capsule by mouth 2 (two) times daily. 60 capsule 0   aspirin  EC 81 MG EC tablet Take 1 tablet (81 mg total) by mouth daily. 90 tablet 0   lamoTRIgine  (LAMICTAL ) 100 MG tablet Take 1 tablet (100 mg total) by mouth 2 (two) times daily. 180 tablet 3   predniSONE  (DELTASONE ) 20 MG tablet Take 2 tablets (40 mg total) by mouth daily with breakfast. For the next four days 8 tablet 0   No current facility-administered medications for this visit.    PAST MEDICAL HISTORY: Past Medical History:  Diagnosis Date   ADHD    Anxiety    Hypertension     PAST SURGICAL HISTORY: No past surgical history on file.  FAMILY HISTORY: Family History  Problem Relation Age of Onset   Other Neg Hx        hypogonadism    SOCIAL HISTORY: Social  History   Socioeconomic History   Marital status: Married    Spouse name: janet   Number of children: 3   Years of education: Not on file   Highest education level: Bachelor's degree (e.g., BA, AB, BS)  Occupational History   Not on file  Tobacco Use   Smoking status: Never    Passive exposure: Never   Smokeless tobacco: Never  Vaping Use   Vaping status: Never Used  Substance and Sexual Activity   Alcohol use: Yes    Alcohol/week: 1.0 standard drink of alcohol    Types: 1 Standard drinks or equivalent per week    Comment: 2-3 per month   Drug use: No   Sexual activity: Yes  Other Topics Concern   Not on file  Social History Narrative   Not on file   Social Drivers of Health   Tobacco Use: Low Risk (12/09/2023)   Patient History    Smoking  Tobacco Use: Never    Smokeless Tobacco Use: Never    Passive Exposure: Never  Physicist, Medical Strain: Not on file  Food Insecurity: Not on file  Transportation Needs: Not on file  Physical Activity: Not on file  Stress: Not on file  Social Connections: Not on file  Intimate Partner Violence: Not on file  Depression (EYV7-0): Not on file  Alcohol Screen: Not on file  Housing: Not on file  Utilities: Not on file  Health Literacy: Not on file     I personally spent a total of *** minutes in the care of the patient today including {Time Based Coding:210964241}.  Harlene Bogaert, AGNP-BC  [facility] [address] [place], [place] [address]  Phone [phone] Fax [phone] Note: This document was prepared with digital dictation and possible smart phrase technology. Any transcriptional errors that result from this process are unintentional.  "

## 2024-11-04 ENCOUNTER — Ambulatory Visit: Admitting: Adult Health
